# Patient Record
Sex: Female | Born: 1975 | Race: Black or African American | Hispanic: No | Smoking: Former smoker
Health system: Southern US, Community
[De-identification: ages and names within clinical notes are randomized; demographics above are authoritative.]

## PROBLEM LIST (undated history)

## (undated) ENCOUNTER — Inpatient Hospital Stay (HOSPITAL_COMMUNITY): Payer: Self-pay

## (undated) DIAGNOSIS — R87629 Unspecified abnormal cytological findings in specimens from vagina: Secondary | ICD-10-CM

## (undated) DIAGNOSIS — M797 Fibromyalgia: Secondary | ICD-10-CM

## (undated) DIAGNOSIS — F329 Major depressive disorder, single episode, unspecified: Secondary | ICD-10-CM

## (undated) DIAGNOSIS — F418 Other specified anxiety disorders: Secondary | ICD-10-CM

## (undated) HISTORY — PX: COLPOSCOPY: SHX161

## (undated) HISTORY — DX: Other specified anxiety disorders: F41.8

## (undated) HISTORY — DX: Unspecified abnormal cytological findings in specimens from vagina: R87.629

## (undated) HISTORY — PX: OTHER SURGICAL HISTORY: SHX169

## (undated) HISTORY — DX: Major depressive disorder, single episode, unspecified: F32.9

---

## 2016-01-05 DIAGNOSIS — F331 Major depressive disorder, recurrent, moderate: Secondary | ICD-10-CM | POA: Diagnosis not present

## 2016-01-06 DIAGNOSIS — M791 Myalgia: Secondary | ICD-10-CM | POA: Diagnosis not present

## 2016-01-06 DIAGNOSIS — F411 Generalized anxiety disorder: Secondary | ICD-10-CM | POA: Diagnosis not present

## 2016-01-06 DIAGNOSIS — R748 Abnormal levels of other serum enzymes: Secondary | ICD-10-CM | POA: Diagnosis not present

## 2016-01-19 DIAGNOSIS — F331 Major depressive disorder, recurrent, moderate: Secondary | ICD-10-CM | POA: Diagnosis not present

## 2016-02-02 DIAGNOSIS — F331 Major depressive disorder, recurrent, moderate: Secondary | ICD-10-CM | POA: Diagnosis not present

## 2016-02-09 DIAGNOSIS — F331 Major depressive disorder, recurrent, moderate: Secondary | ICD-10-CM | POA: Diagnosis not present

## 2016-03-01 DIAGNOSIS — F331 Major depressive disorder, recurrent, moderate: Secondary | ICD-10-CM | POA: Diagnosis not present

## 2016-03-12 DIAGNOSIS — F331 Major depressive disorder, recurrent, moderate: Secondary | ICD-10-CM | POA: Diagnosis not present

## 2016-04-05 DIAGNOSIS — F331 Major depressive disorder, recurrent, moderate: Secondary | ICD-10-CM | POA: Diagnosis not present

## 2016-04-11 DIAGNOSIS — J309 Allergic rhinitis, unspecified: Secondary | ICD-10-CM | POA: Diagnosis not present

## 2016-04-11 DIAGNOSIS — J322 Chronic ethmoidal sinusitis: Secondary | ICD-10-CM | POA: Diagnosis not present

## 2016-04-11 DIAGNOSIS — F324 Major depressive disorder, single episode, in partial remission: Secondary | ICD-10-CM | POA: Diagnosis not present

## 2016-04-11 DIAGNOSIS — Z720 Tobacco use: Secondary | ICD-10-CM | POA: Diagnosis not present

## 2016-04-19 DIAGNOSIS — F331 Major depressive disorder, recurrent, moderate: Secondary | ICD-10-CM | POA: Diagnosis not present

## 2016-04-20 DIAGNOSIS — L299 Pruritus, unspecified: Secondary | ICD-10-CM | POA: Diagnosis not present

## 2017-03-04 ENCOUNTER — Inpatient Hospital Stay (HOSPITAL_COMMUNITY)
Admission: AD | Admit: 2017-03-04 | Discharge: 2017-03-04 | Disposition: A | Discharge status: Home / Self Care | Payer: Medicaid Other | Source: Ambulatory Visit | Attending: Obstetrics and Gynecology | Admitting: Obstetrics and Gynecology

## 2017-03-04 ENCOUNTER — Inpatient Hospital Stay (HOSPITAL_COMMUNITY): Payer: Medicaid Other

## 2017-03-04 ENCOUNTER — Encounter (HOSPITAL_COMMUNITY): Payer: Self-pay | Admitting: *Deleted

## 2017-03-04 DIAGNOSIS — Z3201 Encounter for pregnancy test, result positive: Secondary | ICD-10-CM | POA: Insufficient documentation

## 2017-03-04 DIAGNOSIS — O26891 Other specified pregnancy related conditions, first trimester: Secondary | ICD-10-CM | POA: Insufficient documentation

## 2017-03-04 DIAGNOSIS — R109 Unspecified abdominal pain: Secondary | ICD-10-CM | POA: Diagnosis present

## 2017-03-04 DIAGNOSIS — B9689 Other specified bacterial agents as the cause of diseases classified elsewhere: Secondary | ICD-10-CM

## 2017-03-04 DIAGNOSIS — M797 Fibromyalgia: Secondary | ICD-10-CM | POA: Insufficient documentation

## 2017-03-04 DIAGNOSIS — Z3A01 Less than 8 weeks gestation of pregnancy: Secondary | ICD-10-CM | POA: Insufficient documentation

## 2017-03-04 DIAGNOSIS — O209 Hemorrhage in early pregnancy, unspecified: Secondary | ICD-10-CM | POA: Diagnosis present

## 2017-03-04 DIAGNOSIS — O3680X0 Pregnancy with inconclusive fetal viability, not applicable or unspecified: Secondary | ICD-10-CM

## 2017-03-04 DIAGNOSIS — N760 Acute vaginitis: Secondary | ICD-10-CM | POA: Insufficient documentation

## 2017-03-04 DIAGNOSIS — O23591 Infection of other part of genital tract in pregnancy, first trimester: Secondary | ICD-10-CM | POA: Insufficient documentation

## 2017-03-04 HISTORY — DX: Fibromyalgia: M79.7

## 2017-03-04 LAB — WET PREP, GENITAL
Sperm: NONE SEEN
TRICH WET PREP: NONE SEEN
Yeast Wet Prep HPF POC: NONE SEEN

## 2017-03-04 LAB — URINALYSIS, ROUTINE W REFLEX MICROSCOPIC
Bilirubin Urine: NEGATIVE
GLUCOSE, UA: NEGATIVE mg/dL
Ketones, ur: NEGATIVE mg/dL
Nitrite: NEGATIVE
PH: 5 (ref 5.0–8.0)
PROTEIN: 100 mg/dL — AB
SPECIFIC GRAVITY, URINE: 1.03 (ref 1.005–1.030)

## 2017-03-04 LAB — POCT PREGNANCY, URINE: Preg Test, Ur: POSITIVE — AB

## 2017-03-04 LAB — CBC
HCT: 40.5 % (ref 36.0–46.0)
HEMOGLOBIN: 13.6 g/dL (ref 12.0–15.0)
MCH: 28.8 pg (ref 26.0–34.0)
MCHC: 33.6 g/dL (ref 30.0–36.0)
MCV: 85.8 fL (ref 78.0–100.0)
Platelets: 297 10*3/uL (ref 150–400)
RBC: 4.72 MIL/uL (ref 3.87–5.11)
RDW: 14.9 % (ref 11.5–15.5)
WBC: 13.1 10*3/uL — ABNORMAL HIGH (ref 4.0–10.5)

## 2017-03-04 LAB — HCG, QUANTITATIVE, PREGNANCY: hCG, Beta Chain, Quant, S: 114 m[IU]/mL — ABNORMAL HIGH (ref <5)

## 2017-03-04 MED ORDER — METRONIDAZOLE 500 MG PO TABS
500.0000 mg | ORAL_TABLET | Freq: Two times a day (BID) | ORAL | 0 refills | Status: DC
Start: 2017-03-04 — End: 2017-07-01

## 2017-03-04 NOTE — MAU Provider Note (Signed)
History     CSN: [id number]  Arrival date and time: 03/04/17 1723  First Provider Initiated Contact with Patient 03/04/17 2027      Chief Complaint  Patient presents with  . Vaginal Bleeding  . Abdominal Cramping   HPI  Kerri Guerra is a 41 y.o. G1P0 at [redacted]w[redacted]d by LMP who presents with abdominal cramping and vaginal bleeding. Reports vaginal spotting for the last 2 weeks, was initially pink, then brown. For the last 2 days has had bright red bleeding that she has had to wear a pad for. Not saturating pads & not passing clots. Also reports lower abdominal cramping for the last few weeks, initially pain was in LLQ but is now throughout lower abdomen. Rates pain 3/10 & describes as pressure & cramping. Denies n/v/d or dysuria. Last BM was yesterday. Last intercourse 2 days ago. Has not started prenatal care yet.   OB History    Gravida Para Term Preterm AB Living   1             SAB TAB Ectopic Multiple Live Births                  Past Medical History:  Diagnosis Date  . Fibromyalgia     Past Surgical History:  Procedure Laterality Date  . NO PAST SURGERIES      No family history on file.  Social History  Substance Use Topics  . Smoking status: Not on file  . Smokeless tobacco: Not on file  . Alcohol use Not on file    Allergies: Allergies not on file  No prescriptions prior to admission.    Review of Systems  Constitutional: Negative.   Gastrointestinal: Positive for abdominal pain and constipation. Negative for diarrhea, nausea and vomiting.  Genitourinary: Positive for vaginal bleeding. Negative for dysuria and vaginal discharge.   Physical Exam   Blood pressure 131/77, pulse 75, temperature 98.4 F (36.9 C), temperature source Oral, resp. rate 18, height 5\' 7"  (1.702 m), weight 239 lb (108.4 kg), last menstrual period 01/13/2017.  Physical Exam  Nursing note and vitals reviewed. Constitutional: She is oriented to person, place, and time. She appears  well-developed and well-nourished. No distress.  HENT:  Head: Normocephalic and atraumatic.  Eyes: Conjunctivae are normal. Right eye exhibits no discharge. Left eye exhibits no discharge. No scleral icterus.  Neck: Normal range of motion.  Respiratory: Effort normal. No respiratory distress.  GI: Soft. She exhibits no distension. There is no tenderness.  Genitourinary: Uterus normal. Cervix exhibits discharge (yellow mucoid discharge). Cervix exhibits no motion tenderness and no friability. Right adnexum displays no mass and no tenderness. Left adnexum displays no mass and no tenderness. There is bleeding (no active bleeding; minimal amount of dark red blood in vaginal vault) in the vagina.  Genitourinary Comments: Cervix closed  Neurological: She is alert and oriented to person, place, and time.  Skin: Skin is warm and dry. She is not diaphoretic.  Psychiatric: She has a normal mood and affect. Her behavior is normal. Judgment and thought content normal.    MAU Course  Procedures Results for orders placed or performed during the hospital encounter of 03/04/17 (from the past 24 hour(s))  Urinalysis, Routine w reflex microscopic     Status: Abnormal   Collection Time: 03/04/17  6:00 PM  Result Value Ref Range   Color, Urine AMBER (A) YELLOW   APPearance HAZY (A) CLEAR   Specific Gravity, Urine 1.030 1.005 - 1.030   pH  5.0 5.0 - 8.0   Glucose, UA NEGATIVE NEGATIVE mg/dL   Hgb urine dipstick LARGE (A) NEGATIVE   Bilirubin Urine NEGATIVE NEGATIVE   Ketones, ur NEGATIVE NEGATIVE mg/dL   Protein, ur 784100 (A) NEGATIVE mg/dL   Nitrite NEGATIVE NEGATIVE   Leukocytes, UA LARGE (A) NEGATIVE   RBC / HPF 6-30 0 - 5 RBC/hpf   WBC, UA TOO NUMEROUS TO COUNT 0 - 5 WBC/hpf   Bacteria, UA RARE (A) NONE SEEN   Squamous Epithelial / LPF 6-30 (A) NONE SEEN   Mucous PRESENT   Pregnancy, urine POC     Status: Abnormal   Collection Time: 03/04/17  6:09 PM  Result Value Ref Range   Preg Test, Ur  POSITIVE (A) NEGATIVE  CBC     Status: Abnormal   Collection Time: 03/04/17  8:23 PM  Result Value Ref Range   WBC 13.1 (H) 4.0 - 10.5 K/uL   RBC 4.72 3.87 - 5.11 MIL/uL   Hemoglobin 13.6 12.0 - 15.0 g/dL   HCT 69.640.5 29.536.0 - 28.446.0 %   MCV 85.8 78.0 - 100.0 fL   MCH 28.8 26.0 - 34.0 pg   MCHC 33.6 30.0 - 36.0 g/dL   RDW 13.214.9 44.011.5 - 10.215.5 %   Platelets 297 150 - 400 K/uL  ABO/Rh     Status: None   Collection Time: 03/04/17  8:23 PM  Result Value Ref Range   ABO/RH(D) B POS   hCG, quantitative, pregnancy     Status: Abnormal   Collection Time: 03/04/17  8:23 PM  Result Value Ref Range   hCG, Beta Chain, Quant, S 114 (H) <5 mIU/mL  Wet prep, genital     Status: Abnormal   Collection Time: 03/04/17 10:30 PM  Result Value Ref Range   Yeast Wet Prep HPF POC NONE SEEN NONE SEEN   Trich, Wet Prep NONE SEEN NONE SEEN   Clue Cells Wet Prep HPF POC PRESENT (A) NONE SEEN   WBC, Wet Prep HPF POC MANY (A) NONE SEEN   Sperm NONE SEEN    [place] Ob Comp Less 14 Wks  Result Date: 03/04/2017 CLINICAL DATA:  Spotting and pressure EXAM: OBSTETRIC <14 WK US AND TRANSVAGINAL OB US TECHNIQUE: Both transabdominal and transvaginal ultrasound examinations were performed for complete evaluation of the gestation as well as the maternal uterus, adnexal regions, and pelvic cul-de-sac. Transvaginal technique was performed to assess early pregnancy. COMPARISON:  None. FINDINGS: Intrauterine gestational sac: Not visualized Yolk sac:  Not visualized Embryo:  Not visualized Subchorionic hemorrhage:  None visualized. Maternal uterus/adnexae: Bilateral ovaries are within normal limits. The right ovary measures 2.1 x 3.6 x 2.1 cm. The left ovary measures 1.9 x 3.6 x 2.3 cm. Thickened appearance of the endometrium. Possible small amount of echogenic hemorrhagic material in the cervix. Trace free fluid in the cul-de-sac. IMPRESSION: 1. No intrauterine gestational sac is visualized. Findings consistent with pregnancy of unknown  location of which considerations include recent miscarriage, intrauterine pregnancy too early to visualize, or occult ectopic. Correlation with serial HCG recommended with repeat ultrasound as indicated. 2. Possible small amount of hemorrhagic material within the cervix. Trace amount of free fluid in the cul-de-sac. Electronically Signed   By: Jasmine PangKim  Fujinaga M.D.   On: 03/04/2017 21:04   [place] Ob Transvaginal  Result Date: 03/04/2017 CLINICAL DATA:  Spotting and pressure EXAM: OBSTETRIC <14 WK US AND TRANSVAGINAL OB US TECHNIQUE: Both transabdominal and transvaginal ultrasound examinations were performed for complete evaluation of the gestation  as well as the maternal uterus, adnexal regions, and pelvic cul-de-sac. Transvaginal technique was performed to assess early pregnancy. COMPARISON:  None. FINDINGS: Intrauterine gestational sac: Not visualized Yolk sac:  Not visualized Embryo:  Not visualized Subchorionic hemorrhage:  None visualized. Maternal uterus/adnexae: Bilateral ovaries are within normal limits. The right ovary measures 2.1 x 3.6 x 2.1 cm. The left ovary measures 1.9 x 3.6 x 2.3 cm. Thickened appearance of the endometrium. Possible small amount of echogenic hemorrhagic material in the cervix. Trace free fluid in the cul-de-sac. IMPRESSION: 1. No intrauterine gestational sac is visualized. Findings consistent with pregnancy of unknown location of which considerations include recent miscarriage, intrauterine pregnancy too early to visualize, or occult ectopic. Correlation with serial HCG recommended with repeat ultrasound as indicated. 2. Possible small amount of hemorrhagic material within the cervix. Trace amount of free fluid in the cul-de-sac. Electronically Signed   By: Jasmine Pang M.D.   On: 03/04/2017 21:04    MDM +UPT UA, wet prep, GC/chlamydia, CBC, ABO/Rh, quant hCG, HIV, and [place] today to rule out ectopic pregnancy B positive BHCG 114, ultrasound shows no IUP or adnexal mass Assessment  and Plan  A: 1. Pregnancy of unknown anatomic location   2. Vaginal bleeding in pregnancy, first trimester   3. BV (bacterial vaginosis)    P: Discharge home Rx flagyl Pelvic rest Go to [facility] Thursday morning for bhcg  Discussed reasons to return to MAU GC/CT pending   Judeth Horn 03/04/2017, 8:27 PM

## 2017-03-04 NOTE — Discharge Instructions (Signed)
Return to care  °· If you have heavier bleeding that soaks through more that 2 pads per hour for an hour or more °· If you bleed so much that you feel like you might pass out or you do pass out °· If you have significant abdominal pain that is not improved with Tylenol  °· If you develop a fever > 100.5 ° ° ° ° °Bacterial Vaginosis °Bacterial vaginosis is a vaginal infection that occurs when the normal balance of bacteria in the vagina is disrupted. It results from an overgrowth of certain bacteria. This is the most common vaginal infection among women ages 15-44. °Because bacterial vaginosis increases your risk for STIs (sexually transmitted infections), getting treated can help reduce your risk for chlamydia, gonorrhea, herpes, and HIV (human immunodeficiency virus). Treatment is also important for preventing complications in pregnant women, because this condition can cause an early (premature) delivery. °What are the causes? °This condition is caused by an increase in harmful bacteria that are normally present in small amounts in the vagina. However, the reason that the condition develops is not fully understood. °What increases the risk? °The following factors may make you more likely to develop this condition: °· Having a new sexual partner or multiple sexual partners. °· Having unprotected sex. °· Douching. °· Having an intrauterine device (IUD). °· Smoking. °· Drug and alcohol abuse. °· Taking certain antibiotic medicines. °· Being pregnant. ° °You cannot get bacterial vaginosis from toilet seats, bedding, swimming pools, or contact with objects around you. °What are the signs or symptoms? °Symptoms of this condition include: °· Grey or white vaginal discharge. The discharge can also be watery or foamy. °· A fish-like odor with discharge, especially after sexual intercourse or during menstruation. °· Itching in and around the vagina. °· Burning or pain with urination. ° °Some women with bacterial vaginosis  have no signs or symptoms. °How is this diagnosed? °This condition is diagnosed based on: °· Your medical history. °· A physical exam of the vagina. °· Testing a sample of vaginal fluid under a microscope to look for a large amount of bad bacteria or abnormal cells. Your health care provider may use a cotton swab or a small wooden spatula to collect the sample. ° °How is this treated? °This condition is treated with antibiotics. These may be given as a pill, a vaginal cream, or a medicine that is put into the vagina (suppository). If the condition comes back after treatment, a second round of antibiotics may be needed. °Follow these instructions at home: °Medicines °· Take over-the-counter and prescription medicines only as told by your health care provider. °· Take or use your antibiotic as told by your health care provider. Do not stop taking or using the antibiotic even if you start to feel better. °General instructions °· If you have a female sexual partner, tell her that you have a vaginal infection. She should see her health care provider and be treated if she has symptoms. If you have a female sexual partner, he does not need treatment. °· During treatment: °? Avoid sexual activity until you finish treatment. °? Do not douche. °? Avoid alcohol as directed by your health care provider. °? Avoid breastfeeding as directed by your health care provider. °· Drink enough water and fluids to keep your urine clear or pale yellow. °· Keep the area around your vagina and rectum clean. °? Wash the area daily with warm water. °? Wipe yourself from front to back after using the   toilet. °· Keep all follow-up visits as told by your health care provider. This is important. °How is this prevented? °· Do not douche. °· Wash the outside of your vagina with warm water only. °· Use protection when having sex. This includes latex condoms and dental dams. °· Limit how many sexual partners you have. To help prevent bacterial vaginosis,  it is best to have sex with just one partner (monogamous). °· Make sure you and your sexual partner are tested for STIs. °· Wear cotton or cotton-lined underwear. °· Avoid wearing tight pants and pantyhose, especially during summer. °· Limit the amount of alcohol that you drink. °· Do not use any products that contain nicotine or tobacco, such as cigarettes and e-cigarettes. If you need help quitting, ask your health care provider. °· Do not use illegal drugs. °Where to find more information: °· Centers for Disease Control and Prevention: www.cdc.gov/std °· American Sexual Health Association (ASHA): www.ashastd.org °· U.S. Department of Health and Human Services, Office on Women's Health: www.womenshealth.gov/ or https://www.womenshealth.gov/a-z-topics/bacterial-vaginosis °Contact a health care provider if: °· Your symptoms do not improve, even after treatment. °· You have more discharge or pain when urinating. °· You have a fever. °· You have pain in your abdomen. °· You have pain during sex. °· You have vaginal bleeding between periods. °Summary °· Bacterial vaginosis is a vaginal infection that occurs when the normal balance of bacteria in the vagina is disrupted. °· Because bacterial vaginosis increases your risk for STIs (sexually transmitted infections), getting treated can help reduce your risk for chlamydia, gonorrhea, herpes, and HIV (human immunodeficiency virus). Treatment is also important for preventing complications in pregnant women, because the condition can cause an early (premature) delivery. °· This condition is treated with antibiotic medicines. These may be given as a pill, a vaginal cream, or a medicine that is put into the vagina (suppository). °This information is not intended to replace advice given to you by your health care provider. Make sure you discuss any questions you have with your health care provider. °Document Released: 09/17/2005 Document Revised: 06/02/2016 Document Reviewed:  06/02/2016 °Elsevier Interactive Patient Education © 2017 [employer]. ° °

## 2017-03-04 NOTE — MAU Note (Signed)
Pt presents to MAU with complaints of positive home pregnancy on May 21st. Pt states she started having vaginal spotting two weeks ago and it feels like pressure in her lower abdomen. Last intercourse June 2nd.

## 2017-03-05 LAB — ABO/RH: ABO/RH(D): B POS

## 2017-03-05 LAB — GC/CHLAMYDIA PROBE AMP (~~LOC~~) NOT AT ARMC
Chlamydia: NEGATIVE
Neisseria Gonorrhea: NEGATIVE

## 2017-03-07 ENCOUNTER — Ambulatory Visit: Payer: Medicaid Other | Admitting: *Deleted

## 2017-03-07 DIAGNOSIS — O3680X0 Pregnancy with inconclusive fetal viability, not applicable or unspecified: Secondary | ICD-10-CM

## 2017-03-07 LAB — HCG, QUANTITATIVE, PREGNANCY: HCG, BETA CHAIN, QUANT, S: 16 m[IU]/mL — AB (ref <5)

## 2017-03-07 NOTE — Patient Instructions (Signed)

## 2017-03-07 NOTE — Progress Notes (Signed)
Pt in for stat hcg level. Thinks that she has had an sab. Pt having some bleeding and no pain. Advised patient that I will call her this afternoon with her results. [phone] Pt requested resources for pregnancy loss, pt given information.  Discussed results with Dr. Adrian BlackwaterStinson he states that patient does not require any followup and has had a miscarriage. Pt informed of results and had no further questions or concerns.

## 2017-06-06 DIAGNOSIS — F411 Generalized anxiety disorder: Secondary | ICD-10-CM | POA: Diagnosis not present

## 2017-06-14 ENCOUNTER — Ambulatory Visit: Payer: Self-pay | Admitting: Family Medicine

## 2017-06-17 DIAGNOSIS — F411 Generalized anxiety disorder: Secondary | ICD-10-CM | POA: Diagnosis not present

## 2017-06-27 DIAGNOSIS — F411 Generalized anxiety disorder: Secondary | ICD-10-CM | POA: Diagnosis not present

## 2017-07-01 ENCOUNTER — Encounter: Payer: Self-pay | Admitting: Family Medicine

## 2017-07-01 ENCOUNTER — Ambulatory Visit (INDEPENDENT_AMBULATORY_CARE_PROVIDER_SITE_OTHER): Payer: BLUE CROSS/BLUE SHIELD | Admitting: Family Medicine

## 2017-07-01 ENCOUNTER — Other Ambulatory Visit (HOSPITAL_COMMUNITY)
Admission: RE | Admit: 2017-07-01 | Discharge: 2017-07-01 | Disposition: A | Discharge status: Home / Self Care | Payer: BLUE CROSS/BLUE SHIELD | Source: Ambulatory Visit | Attending: Family Medicine | Admitting: Family Medicine

## 2017-07-01 VITALS — Ht 67.5 in | Wt 241.0 lb

## 2017-07-01 VITALS — BP 112/84 | HR 67 | Temp 98.3°F | Ht 67.0 in | Wt 241.0 lb

## 2017-07-01 DIAGNOSIS — N898 Other specified noninflammatory disorders of vagina: Secondary | ICD-10-CM

## 2017-07-01 DIAGNOSIS — Z1151 Encounter for screening for human papillomavirus (HPV): Secondary | ICD-10-CM

## 2017-07-01 DIAGNOSIS — Z124 Encounter for screening for malignant neoplasm of cervix: Secondary | ICD-10-CM

## 2017-07-01 DIAGNOSIS — Z9109 Other allergy status, other than to drugs and biological substances: Secondary | ICD-10-CM | POA: Diagnosis not present

## 2017-07-01 DIAGNOSIS — Z01419 Encounter for gynecological examination (general) (routine) without abnormal findings: Secondary | ICD-10-CM | POA: Diagnosis not present

## 2017-07-01 DIAGNOSIS — Z113 Encounter for screening for infections with a predominantly sexual mode of transmission: Secondary | ICD-10-CM

## 2017-07-01 DIAGNOSIS — M797 Fibromyalgia: Secondary | ICD-10-CM | POA: Diagnosis not present

## 2017-07-01 DIAGNOSIS — F418 Other specified anxiety disorders: Secondary | ICD-10-CM

## 2017-07-01 DIAGNOSIS — Z0000 Encounter for general adult medical examination without abnormal findings: Secondary | ICD-10-CM | POA: Insufficient documentation

## 2017-07-01 DIAGNOSIS — R87610 Atypical squamous cells of undetermined significance on cytologic smear of cervix (ASC-US): Secondary | ICD-10-CM | POA: Diagnosis not present

## 2017-07-01 DIAGNOSIS — F32A Depression, unspecified: Secondary | ICD-10-CM | POA: Insufficient documentation

## 2017-07-01 HISTORY — DX: Other specified anxiety disorders: F41.8

## 2017-07-01 MED ORDER — BUSPIRONE HCL 5 MG PO TABS: 5.0000 mg | ORAL_TABLET | Freq: Three times a day (TID) | ORAL | 2 refills | Status: DC

## 2017-07-01 MED ORDER — TRAZODONE HCL 50 MG PO TABS: 25.0000 mg | ORAL_TABLET | Freq: Every evening | ORAL | 2 refills | Status: DC | PRN

## 2017-07-01 MED ORDER — DULOXETINE HCL 30 MG PO CPEP: 30.0000 mg | ORAL_CAPSULE | Freq: Every day | ORAL | 2 refills | Status: DC

## 2017-07-01 NOTE — Progress Notes (Signed)
GYNECOLOGY ANNUAL PREVENTATIVE CARE ENCOUNTER NOTE  Subjective:   Kerri Guerra is a 41 y.o. G35P0010 female here for a routine annual gynecologic exam.  Current complaints: vaginal odor.   Denies abnormal vaginal bleeding, discharge, pelvic pain, problems with intercourse or other gynecologic concerns.    Gynecologic History Patient's last menstrual period was 01/13/2017. Patient is sexually active  Contraception: OCP (estrogen/progesterone) Last Pap: 2015. Results were: normal. Has history of abnormal PAP several years ago. Last mammogram: n/a  Obstetric History OB History  Gravida Para Term Preterm AB Living  1       1    SAB TAB Ectopic Multiple Live Births  1            # Outcome Date GA Lbr Len/2nd Weight Sex Delivery Anes PTL Lv  1 SAB 04/08/17 [redacted]w[redacted]d             Past Medical History:  Diagnosis Date  . Fibromyalgia   . Vaginal Pap smear, abnormal     Past Surgical History:  Procedure Laterality Date  . COLPOSCOPY      No current outpatient prescriptions on file prior to visit.   No current facility-administered medications on file prior to visit.     No Known Allergies  Social History   Social History  . Marital status: Single    Spouse name: N/A  . Number of children: N/A  . Years of education: N/A   Occupational History  . Not on file.   Social History Main Topics  . Smoking status: Light Tobacco Smoker  . Smokeless tobacco: Never Used  . Alcohol use 1.8 oz/week    3 Standard drinks or equivalent per week  . Drug use: No  . Sexual activity: Yes    Birth control/ protection: Pill   Other Topics Concern  . Not on file   Social History Narrative  . No narrative on file    Family History  Problem Relation Age of Onset  . Cancer Maternal Grandmother   . Cancer Father        PROSTATE  . Diabetes Mother   . Hypertension Mother   . Stroke Neg Hx     The following portions of the patient's history were reviewed and updated as  appropriate: allergies, current medications, past family history, past medical history, past social history, past surgical history and problem list.  Review of Systems Pertinent items are noted in HPI.   Objective:  Ht 5' 7.5" (1.715 m)   Wt 241 lb (109.3 kg)   LMP 01/13/2017   Breastfeeding? No   BMI 37.19 kg/m  CONSTITUTIONAL: Well-developed, well-nourished female in no acute distress.  HENT:  Normocephalic, atraumatic, External right and left ear normal. Oropharynx is clear and moist EYES: Conjunctivae and EOM are normal. Pupils are equal, round, and reactive to light. No scleral icterus.  NECK: Normal range of motion, supple, no masses.  Normal thyroid.   CARDIOVASCULAR: Normal heart rate noted, regular rhythm RESPIRATORY: Clear to auscultation bilaterally. Effort and breath sounds normal, no problems with respiration noted. BREASTS: Symmetric in size. No masses, skin changes, nipple drainage, or lymphadenopathy. ABDOMEN: Soft, normal bowel sounds, no distention noted.  No tenderness, rebound or guarding.  PELVIC: Normal appearing external genitalia; normal appearing vaginal mucosa and cervix.  No abnormal discharge noted.  Pap smear obtained.  Normal uterine size, no other palpable masses, no uterine or adnexal tenderness. MUSCULOSKELETAL: Normal range of motion. No tenderness.  No cyanosis, clubbing, or edema.  2+ distal pulses. SKIN: Skin is warm and dry. No rash noted. Not diaphoretic. No erythema. No pallor. NEUROLOGIC: Alert and oriented to person, place, and time. Normal reflexes, muscle tone coordination. No cranial nerve deficit noted. PSYCHIATRIC: Normal mood and affect. Normal behavior. Normal judgment and thought content.  Assessment:  Annual gynecologic examination with pap smear   Plan:  Will follow up results of pap smear and manage accordingly.  STD testing discussed. Patient declined testing Discussed exercise and diet Calcium and Vitamin D supplementation  discussed  Routine preventative health maintenance measures emphasized. Please refer to After Visit Summary for other counseling recommendations.    Candelaria Celeste, DO [facility]

## 2017-07-01 NOTE — Patient Instructions (Addendum)
[email or web address]  If you do not hear anything about your referral in the next 1-2 weeks, call our office and ask for an update.  Let us know if you need anything.

## 2017-07-01 NOTE — Progress Notes (Signed)
LMP:06-15-17

## 2017-07-01 NOTE — Progress Notes (Signed)
Chief Complaint  Patient presents with  . Establish Care       New Patient Visit SUBJECTIVE: HPI: Kerri Guerra is an 41 y.o.female who is being seen for establishing care.  She has a history of fibromyalgia. She also has a history of anxiety with depression. Before her current job, she had quit her other job. There is a span of time where she was uninsured and stopped all of her medication. She used to be on trazodone as needed for sleep, BuSpar 5 mg twice daily, and Cymbalta 60 mg daily. She's been without this for several months. She is currently seeing a Veterinary surgeon. Her job serves as a stressor as well as a recent miscarriage over the summer. That was her first pregnancy. While there are no formal diagnoses, she believes her mother has anxiety. No other family history of psychiatric illnesses. She has no thoughts of harming herself or others. She is currently looking for a new job. She does walk, however does not exercise very routinely. She does not lift weights. She does notice that she feels better after exercise.   No Known Allergies  Past Medical History:  Diagnosis Date  . Anxiety with depression 07/01/2017  . Fibromyalgia   . Vaginal Pap smear, abnormal    Past Surgical History:  Procedure Laterality Date  . COLPOSCOPY     Social History   Social History  . Marital status: Single   Social History Main Topics  . Smoking status: Light Tobacco Smoker  . Smokeless tobacco: Never Used  . Alcohol use 1.8 oz/week    3 Standard drinks or equivalent per week  . Drug use: No  . Sexual activity: Yes    Birth control/ protection: Pill   Family History  Problem Relation Age of Onset  . Cancer Maternal Grandmother   . Cancer Father        PROSTATE  . Diabetes Mother   . Hypertension Mother   . Stroke Neg Hx      Current Outpatient Prescriptions:  .  ibuprofen (ADVIL,MOTRIN) 200 MG tablet, Take 200 mg by mouth every 8 (eight) hours as needed., Disp: , Rfl:  .  loratadine  (CLARITIN) 10 MG tablet, Take 10 mg by mouth daily., Disp: , Rfl:  .  norethindrone-ethinyl estradiol (JUNEL FE,GILDESS FE,LOESTRIN FE) 1-20 MG-MCG tablet, Take 1 tablet by mouth daily., Disp: , Rfl:  .  busPIRone (BUSPAR) 5 MG tablet, Take 1 tablet (5 mg total) by mouth 3 (three) times daily., Disp: 60 tablet, Rfl: 2 .  DULoxetine (CYMBALTA) 30 MG capsule, Take 1 capsule (30 mg total) by mouth daily., Disp: 30 capsule, Rfl: 2 .  traZODone (DESYREL) 50 MG tablet, Take 0.5-1 tablets (25-50 mg total) by mouth at bedtime as needed for sleep., Disp: 30 tablet, Rfl: 2  Patient's last menstrual period was 01/13/2017.  ROS MSK: +back pain  Neuro: Denies Weakness    OBJECTIVE: BP 112/84 (BP Location: Left Arm, Patient Position: Sitting, Cuff Size: Large)   Pulse 67   Temp 98.3 F (36.8 C) (Oral)   Ht  (1.702 m)   Wt 241 lb (109.3 kg)   LMP 01/13/2017   SpO2 99%   BMI 37.75 kg/m   Constitutional: -  VS reviewed -  Well developed, well nourished, appears stated age -  No apparent distress  Psychiatric: -  Oriented to person, place, and time -  Memory intact -  Affect and mood normal -  Fluent conversation, good eye contact -  Judgment and insight age appropriate  Eye: -  Conjunctivae clear, no discharge -  Pupils symmetric, round, reactive to light  ENMT: -  MMM    Pharynx moist, no exudate, no erythema  Neck: -  No gross swelling, no palpable masses -  Thyroid midline, not enlarged, mobile, no palpable masses  Cardiovascular: -  RRR -  No LE edema  Respiratory: -  Normal respiratory effort, no accessory muscle use, no retraction -  Breath sounds equal, no wheezes, no ronchi, no crackles  Musculoskeletal: -  5/5 strength throughout -  + Tender to palpation over paraspinal musculature from cervical spine to lumbar spine   Skin: -  No significant lesion on inspection -  Warm and dry to palpation   ASSESSMENT/PLAN: Fibromyalgia - Plan: DULoxetine (CYMBALTA) 30 MG  capsule  Anxiety with depression - Plan: DULoxetine (CYMBALTA) 30 MG capsule, traZODone (DESYREL) 50 MG tablet, busPIRone (BUSPAR) 5 MG tablet  Environmental allergies - Plan: Ambulatory referral to Allergy  Patient instructed to sign release of records form from her previous PCP. Patient should return in 6-8 weeks to see how she is doing back on medicine- will start her at lower dose as she has been off of Cymbalta for a while. [facility]guide info given in avs. Refer to allergy for pt requested allergy testing. The patient voiced understanding and agreement to the plan.   Jilda Roche [place], DO 07/01/17  12:11 PM

## 2017-07-01 NOTE — Progress Notes (Signed)
Pre visit review using our clinic review tool, if applicable. No additional management support is needed unless otherwise documented below in the visit note. 

## 2017-07-01 NOTE — Patient Instructions (Signed)

## 2017-07-03 NOTE — Addendum Note (Signed)
Addended by: Anell Barr on: 07/03/2017 04:57 PM   Modules accepted: Orders

## 2017-07-05 LAB — CYTOLOGY - PAP
BACTERIAL VAGINITIS: NEGATIVE
CHLAMYDIA, DNA PROBE: NEGATIVE
Candida vaginitis: NEGATIVE
DIAGNOSIS: UNDETERMINED — AB
HPV: DETECTED — AB
NEISSERIA GONORRHEA: NEGATIVE
Trichomonas: NEGATIVE

## 2017-07-08 DIAGNOSIS — F411 Generalized anxiety disorder: Secondary | ICD-10-CM | POA: Diagnosis not present

## 2017-07-11 ENCOUNTER — Telehealth: Payer: Self-pay

## 2017-07-11 NOTE — Telephone Encounter (Signed)
-----   Message from Levie Heritage, DO sent at 07/05/2017 11:38 AM EDT ----- ASCUS with + HPV. Needs colposcopy. Please inform patient.

## 2017-07-11 NOTE — Telephone Encounter (Signed)
Left message for patient to return call to office.  Emmer Lillibridge RNBSN 

## 2017-07-16 DIAGNOSIS — F411 Generalized anxiety disorder: Secondary | ICD-10-CM | POA: Diagnosis not present

## 2017-07-20 DIAGNOSIS — F4322 Adjustment disorder with anxiety: Secondary | ICD-10-CM | POA: Diagnosis not present

## 2017-07-25 NOTE — Telephone Encounter (Signed)
Patient scheduled for 08-01-17. Armandina StammerJennifer Aamna Mallozzi RNBSN

## 2017-07-27 DIAGNOSIS — F4322 Adjustment disorder with anxiety: Secondary | ICD-10-CM | POA: Diagnosis not present

## 2017-07-29 DIAGNOSIS — F411 Generalized anxiety disorder: Secondary | ICD-10-CM | POA: Diagnosis not present

## 2017-07-30 ENCOUNTER — Telehealth: Payer: Self-pay

## 2017-07-30 NOTE — Telephone Encounter (Signed)
Attempted to reach patient to speak with her about the upcoming appointment for colposcopy.   Unable to leave message because mailbox full. Armandina StammerJennifer Njeri Vicente RNBSN

## 2017-08-01 ENCOUNTER — Encounter: Payer: BLUE CROSS/BLUE SHIELD | Admitting: Family Medicine

## 2017-08-01 DIAGNOSIS — R87610 Atypical squamous cells of undetermined significance on cytologic smear of cervix (ASC-US): Secondary | ICD-10-CM

## 2017-08-07 DIAGNOSIS — R109 Unspecified abdominal pain: Secondary | ICD-10-CM | POA: Diagnosis not present

## 2017-08-08 DIAGNOSIS — F4322 Adjustment disorder with anxiety: Secondary | ICD-10-CM | POA: Diagnosis not present

## 2017-08-14 DIAGNOSIS — F411 Generalized anxiety disorder: Secondary | ICD-10-CM | POA: Diagnosis not present

## 2017-08-19 ENCOUNTER — Telehealth: Payer: Self-pay | Admitting: *Deleted

## 2017-08-19 ENCOUNTER — Ambulatory Visit (INDEPENDENT_AMBULATORY_CARE_PROVIDER_SITE_OTHER): Payer: BLUE CROSS/BLUE SHIELD | Admitting: Allergy & Immunology

## 2017-08-19 ENCOUNTER — Encounter: Payer: Self-pay | Admitting: Allergy & Immunology

## 2017-08-19 VITALS — BP 126/72 | HR 81 | Temp 98.2°F | Resp 18 | Ht 68.0 in | Wt 241.8 lb

## 2017-08-19 DIAGNOSIS — J302 Other seasonal allergic rhinitis: Secondary | ICD-10-CM | POA: Insufficient documentation

## 2017-08-19 DIAGNOSIS — J3089 Other allergic rhinitis: Secondary | ICD-10-CM | POA: Diagnosis not present

## 2017-08-19 MED ORDER — AZELASTINE-FLUTICASONE 137-50 MCG/ACT NA SUSP: 2.0000 | NASAL | 5 refills | Status: DC

## 2017-08-19 MED ORDER — MONTELUKAST SODIUM 10 MG PO TABS: 10.0000 mg | ORAL_TABLET | Freq: Every day | ORAL | 5 refills | Status: DC

## 2017-08-19 NOTE — Telephone Encounter (Signed)
Dymista pa received. Pa done in nctracks pa approved [phone]

## 2017-08-19 NOTE — Patient Instructions (Addendum)
1. Seasonal and perennial allergic rhinitis - Testing today showed: trees, weeds, grasses, indoor molds, outdoor molds, dust mites, cat, dog and cockroach - Avoidance measures provided. - Continue with: Claritin-D (but try to use it less often since you can have rebound congestion) - Start taking: Singulair (montelukast) 10mg  daily and Dymista (fluticasone/azelastine) two sprays per nostril 1-2 times daily as needed  - Samples of Allegra, Xyzal, and Zyrtec provided (these are stronger antihistamines compared to Claritin).  - You can use an extra dose of the antihistamine, if needed, for breakthrough symptoms.  - Consider nasal saline rinses 1-2 times daily to remove allergens from the nasal cavities as well as help with mucous clearance (this is especially helpful to do before the nasal sprays are given) - Consider allergy shots as a means of long-term control. - Allergy shots "re-train" and "reset" the immune system to ignore environmental allergens and decrease the resulting immune response to those allergens (sneezing, itchy watery eyes, runny nose, nasal congestion, etc).    - Allergy shots improve symptoms in 75-85% of patients.  - Check your insurance to make sure that you are OK with any co-payments and call us back to confirm.  2. Return in about 3 months (around 11/19/2017).  Please inform us of any Emergency Department visits, hospitalizations, or changes in symptoms. Call us before going to the ED for breathing or allergy symptoms since we might be able to fit you in for a sick visit. Feel free to contact us anytime with any questions, problems, or concerns.  It was a pleasure to meet you today! Enjoy the Thanksgiving season!  Websites that have reliable patient information: 1. American Academy of Asthma, Allergy, and Immunology: www.aaaai.org 2. Food Allergy Research and Education (FARE): foodallergy.org 3. Mothers of Asthmatics: http://www.asthmacommunitynetwork.org 4. American  College of Allergy, Asthma, and Immunology: www.acaai.org  Reducing Pollen Exposure  The American Academy of Allergy, Asthma and Immunology suggests the following steps to reduce your exposure to pollen during allergy seasons.    1. Do not hang sheets or clothing out to dry; pollen may collect on these items. 2. Do not mow lawns or spend time around freshly cut grass; mowing stirs up pollen. 3. Keep windows closed at night.  Keep car windows closed while driving. 4. Minimize morning activities outdoors, a time when pollen counts are usually at their highest. 5. Stay indoors as much as possible when pollen counts or humidity is high and on windy days when pollen tends to remain in the air longer. 6. Use air conditioning when possible.  Many air conditioners have filters that trap the pollen spores. 7. Use a HEPA room air filter to remove pollen form the indoor air you breathe.  Control of Mold Allergen   Mold and fungi can grow on a variety of surfaces provided certain temperature and moisture conditions exist.  Outdoor molds grow on plants, decaying vegetation and soil.  The major outdoor mold, Alternaria and Cladosporium, are found in very high numbers during hot and dry conditions.  Generally, a late Summer - Fall peak is seen for common outdoor fungal spores.  Rain will temporarily lower outdoor mold spore count, but counts rise rapidly when the rainy period ends.  The most important indoor molds are Aspergillus and Penicillium.  Dark, humid and poorly ventilated basements are ideal sites for mold growth.  The next most common sites of mold growth are the bathroom and the kitchen.  Outdoor (Seasonal) Mold Control  Positive outdoor molds via skin  testing: Alternaria, Cladosporium, Bipolaris (Helminthsporium), Drechslera (Curvalaria) and Mucor  1. Use air conditioning and keep windows closed 2. Avoid exposure to decaying vegetation. 3. Avoid leaf raking. 4. Avoid grain  handling. 5. Consider wearing a face mask if working in moldy areas.  6.   Indoor (Perennial) Mold Control   Positive indoor molds via skin testing: Aspergillus, Penicillium, Fusarium, Aureobasidium (Pullulara) and Rhizopus  1. Maintain humidity below 50%. 2. Clean washable surfaces with 5% bleach solution. 3. Remove sources e.g. contaminated carpets.  Control of House Dust Mite Allergen    House dust mites play a major role in allergic asthma and rhinitis.  They occur in environments with high humidity wherever human skin, the food for dust mites is found. High levels have been detected in dust obtained from mattresses, pillows, carpets, upholstered furniture, bed covers, clothes and soft toys.  The principal allergen of the house dust mite is found in its feces.  A gram of dust may contain 1,000 mites and 250,000 fecal particles.  Mite antigen is easily measured in the air during house cleaning activities.    1. Encase mattresses, including the box spring, and pillow, in an air tight cover.  Seal the zipper end of the encased mattresses with wide adhesive tape. 2. Wash the bedding in water of 130 degrees Farenheit weekly.  Avoid cotton comforters/quilts and flannel bedding: the most ideal bed covering is the dacron comforter. 3. Remove all upholstered furniture from the bedroom. 4. Remove carpets, carpet padding, rugs, and non-washable window drapes from the bedroom.  Wash drapes weekly or use plastic window coverings. 5. Remove all non-washable stuffed toys from the bedroom.  Wash stuffed toys weekly. 6. Have the room cleaned frequently with a vacuum cleaner and a damp dust-mop.  The patient should not be in a room which is being cleaned and should wait 1 hour after cleaning before going into the room. 7. Close and seal all heating outlets in the bedroom.  Otherwise, the room will become filled with dust-laden air.  An electric heater can be used to heat the room. 8. Reduce indoor  humidity to less than 50%.  Do not use a humidifier.  Control of Dog or Cat Allergen  Avoidance is the best way to manage a dog or cat allergy. If you have a dog or cat and are allergic to dog or cats, consider removing the dog or cat from the home. If you have a dog or cat but don't want to find it a new home, or if your family wants a pet even though someone in the household is allergic, here are some strategies that may help keep symptoms at bay:  1. Keep the pet out of your bedroom and restrict it to only a few rooms. Be advised that keeping the dog or cat in only one room will not limit the allergens to that room. 2. Don't pet, hug or kiss the dog or cat; if you do, wash your hands with soap and water. 3. High-efficiency particulate air (HEPA) cleaners run continuously in a bedroom or living room can reduce allergen levels over time. 4. Regular use of a high-efficiency vacuum cleaner or a central vacuum can reduce allergen levels. 5. Giving your dog or cat a bath at least once a week can reduce airborne allergen.  Control of Cockroach Allergen  Cockroach allergen has been identified as an important cause of acute attacks of asthma, especially in urban settings.  There are fifty-five species of cockroach that  exist in the [place], however only three, the [place], [place] and [place] species produce allergen that can affect patients with Asthma.  Allergens can be obtained from fecal particles, egg casings and secretions from cockroaches.    1. Remove food sources. 2. Reduce access to water. 3. Seal access and entry points. 4. Spray runways with 0.5-1% Diazinon or Chlorpyrifos 5. Blow boric acid power under stoves and refrigerator. 6. Place bait stations (hydramethylnon) at feeding sites.

## 2017-08-19 NOTE — Progress Notes (Addendum)
NEW PATIENT  Date of Service/Encounter:  08/19/17  Referring provider: Sharlene Dory, DO   Assessment:   Seasonal and perennial allergic rhinitis (trees, weeds, grasses, indoor molds, outdoor molds, dust mites, cat, dog and cockroach)  Plan/Recommendations:   1. Seasonal and perennial allergic rhinitis - Testing today showed: trees, weeds, grasses, indoor molds, outdoor molds, dust mites, cat, dog and cockroach - Avoidance measures provided. - Continue with: Claritin-D (but try to use it less often since you can have rebound congestion) - Start taking: Singulair (montelukast) 10mg  daily and Dymista (fluticasone/azelastine) two sprays per nostril 1-2 times daily as needed  - Samples of Allegra, Xyzal, and Zyrtec provided (these are stronger antihistamines compared to Claritin).  - You can use an extra dose of the antihistamine, if needed, for breakthrough symptoms.  - Consider nasal saline rinses 1-2 times daily to remove allergens from the nasal cavities as well as help with mucous clearance (this is especially helpful to do before the nasal sprays are given) - Consider allergy shots as a means of long-term control. - Allergy shots "re-train" and "reset" the immune system to ignore environmental allergens and decrease the resulting immune response to those allergens (sneezing, itchy watery eyes, runny nose, nasal congestion, etc).    - Allergy shots improve symptoms in 75-85% of patients.  - Check your insurance to make sure that you are OK with any co-payments and call us back to confirm.  2. Return in about 3 months (around 11/19/2017).   Subjective:   Kerri Guerra is a 41 y.o. female presenting today for evaluation of  Chief Complaint  Patient presents with  . Allergic Rhinitis   . Allergy Testing    Kerri Guerra has a history of the following: Patient Active Problem List   Diagnosis Date Noted  . Seasonal and perennial allergic rhinitis 08/19/2017  .  Fibromyalgia 07/01/2017  . Anxiety with depression 07/01/2017    History obtained from: chart review and patient.  Kerri Guerra was referred by Sharlene Dory, DO.     Kerri Guerra is a 41 y.o. female presenting for an evaluation of allergic rhinitis. She has had problems since moving from [place] 12 years ago. She moved to [place] (8 years), [place]  (2 years), and then moving to [place] since January 2018. 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 She currently works in the [employer] at [employer].   She reports issues throughout the year. It started more spring related with sinusitis, but then worsened from that point. She has been on Claritin D on 3-4 times per week. This is providing some relief. She does use nose sprays, and has never found them helpful. She has used Flonase, Nasacort, and Nasonex without improvement. She estimates that she needs antibiotics very rarely, maybe once. She does use some OTC eye drops. She has never been allergy tested previously. She does get some mucousy with beers, but she not a big beer drinker; symptoms are somewhat hours removed. She denies asthma and urticaria as well as eczema.   Otherwise, there is no history of other atopic diseases, including asthma, drug allergies, food allergies, environmental allergies, stinging insect allergies, or urticaria. There is no significant infectious history. Vaccinations are up to date.    Past Medical History: Patient Active Problem List   Diagnosis Date Noted  . Seasonal and perennial allergic rhinitis 08/19/2017  . Fibromyalgia 07/01/2017  . Anxiety with depression 07/01/2017    Medication List:  Allergies as of 08/19/2017   No Known Allergies     Medication List        Accurate as of 08/19/17 12:46 PM. Always use your most recent med list.          Azelastine-Fluticasone 137-50 MCG/ACT Susp Commonly known as:  DYMISTA Place 2 sprays 1 day or 1  dose into both nostrils.   B-12 PO Take by mouth.   busPIRone 5 MG tablet Commonly known as:  BUSPAR Take 1 tablet (5 mg total) by mouth 3 (three) times daily.   DULoxetine 30 MG capsule Commonly known as:  CYMBALTA Take 1 capsule (30 mg total) by mouth daily.   ibuprofen 200 MG tablet Commonly known as:  ADVIL,MOTRIN Take 200 mg by mouth every 8 (eight) hours as needed.   lactobacillus acidophilus Tabs tablet Take 1 tablet daily by mouth.   loratadine 10 MG tablet Commonly known as:  CLARITIN Take 10 mg by mouth daily.   montelukast 10 MG tablet Commonly known as:  SINGULAIR Take 1 tablet (10 mg total) at bedtime by mouth.   SPRINTEC 28 0.25-35 MG-MCG tablet Generic drug:  norgestimate-ethinyl estradiol Take 1 tablet by mouth daily.   traZODone 50 MG tablet Commonly known as:  DESYREL Take 0.5-1 tablets (25-50 mg total) by mouth at bedtime as needed for sleep.       Birth History: non-contributory.   Developmental History: non-contributory.   Past Surgical History: Past Surgical History:  Procedure Laterality Date  . COLPOSCOPY       Family History: Family History  Problem Relation Age of Onset  . Cancer Maternal Grandmother   . Cancer Father        PROSTATE  . Diabetes Mother   . Hypertension Mother   . Stroke Neg Hx      Social History: Kerri Guerra lives at home with her boyfriend as well as a Nurse, mental healthdog. She does know that she is allergic to cats and dogs. They live in an apartment. There is carpeting throughout the apartment. They have electric heating and central cooling. There are no dust mite coverings on the bedding. There is tobacco exposure. She has been smoking around one pack per two weeks since 1997; they have recently changed to vaping.     Review of Systems: a 14-point review of systems is pertinent for what is mentioned in HPI.  Otherwise, all other systems were negative. Constitutional: negative other than that listed in the HPI Eyes:  negative other than that listed in the HPI Ears, nose, mouth, throat, and face: negative other than that listed in the HPI Respiratory: negative other than that listed in the HPI Cardiovascular: negative other than that listed in the HPI Gastrointestinal: negative other than that listed in the HPI Genitourinary: negative other than that listed in the HPI Integument: negative other than that listed in the HPI Hematologic: negative other than that listed in the HPI Musculoskeletal: negative other than that listed in the HPI Neurological: negative other than that listed in the HPI Allergy/Immunologic: negative other than that listed in the HPI    Objective:   Blood pressure 126/72, pulse 81, temperature 98.2 F (36.8 C), resp. rate 18, height 5\' 8"  (1.727 m), weight 241 lb 12.8 oz (109.7 kg), SpO2 93 %. Body mass index is 36.77 kg/m.   Physical Exam:  General: Alert, interactive, in no acute distress. Very pleasant female.  Eyes: No conjunctival injection bilaterally, no discharge on the right, no discharge on the left and  no Horner-Trantas dots present. PERRL bilaterally. EOMI without pain. No photophobia.  Ears: Right TM pearly gray with normal light reflex, Left TM pearly gray with normal light reflex, Right TM intact without perforation and Left TM intact without perforation.  Nose/Throat: External nose within normal limits, nasal crease present and septum midline. Turbinates markedly edematous and pale with clear discharge. Posterior oropharynx erythematous with cobblestoning in the posterior oropharynx. Tonsils 2+ without exudates.  Tongue without thrush. Neck: Supple without thyromegaly. Trachea midline. Adenopathy: shoddy bilateral anterior cervical lymphadenopathy and no enlarged lymph nodes appreciated in the occipital, axillary, epitrochlear, inguinal, or popliteal regions. Lungs: Clear to auscultation without wheezing, rhonchi or rales. No increased work of  breathing. CV: Normal S1/S2. No murmurs. Capillary refill <2 seconds.  Abdomen: Nondistended, nontender. No guarding or rebound tenderness. Bowel sounds present in all fields and hypoactive  Skin: Warm and dry, without lesions or rashes. Extremities:  No clubbing, cyanosis or edema. Neuro:   Grossly intact. No focal deficits appreciated. Responsive to questions.  Diagnostic studies:   Allergy Studies:   Indoor/Outdoor Percutaneous Adult Environmental Panel: positive to Df mite, Dp mites, cat and dog. Otherwise negative with adequate controls.  Indoor/Outdoor Selected Intradermal Environmental Panel: positive to [place] grass, Johnson grass, ragweed mix, weed mix, tree mix, mold mix #1, mold mix #2, mold mix #3, mold mix #4, cockroach and mite mix. Otherwise negative with adequate controls.        Malachi BondsJoel Baptiste Littler, MD [facility]

## 2017-08-29 DIAGNOSIS — F4322 Adjustment disorder with anxiety: Secondary | ICD-10-CM | POA: Diagnosis not present

## 2017-09-02 DIAGNOSIS — F411 Generalized anxiety disorder: Secondary | ICD-10-CM | POA: Diagnosis not present

## 2017-09-04 ENCOUNTER — Other Ambulatory Visit: Payer: Self-pay | Admitting: Allergy & Immunology

## 2017-09-04 MED ORDER — LEVOCETIRIZINE DIHYDROCHLORIDE 5 MG PO TABS: 5.0000 mg | ORAL_TABLET | Freq: Every evening | ORAL | 5 refills | Status: DC

## 2017-09-04 NOTE — Telephone Encounter (Signed)
Patient was seen by Dr. Dellis AnesGallagher on 08-19-17 and he gave her a sample of Zyzal. She said it worked and [facility] [address]

## 2017-09-11 DIAGNOSIS — J029 Acute pharyngitis, unspecified: Secondary | ICD-10-CM | POA: Diagnosis not present

## 2017-09-11 DIAGNOSIS — J36 Peritonsillar abscess: Secondary | ICD-10-CM | POA: Diagnosis not present

## 2017-09-12 DIAGNOSIS — F411 Generalized anxiety disorder: Secondary | ICD-10-CM | POA: Diagnosis not present

## 2017-09-14 DIAGNOSIS — F4322 Adjustment disorder with anxiety: Secondary | ICD-10-CM | POA: Diagnosis not present

## 2017-09-25 ENCOUNTER — Other Ambulatory Visit: Payer: Self-pay | Admitting: Family Medicine

## 2017-09-25 DIAGNOSIS — F418 Other specified anxiety disorders: Secondary | ICD-10-CM

## 2017-09-25 DIAGNOSIS — M797 Fibromyalgia: Secondary | ICD-10-CM

## 2017-09-26 DIAGNOSIS — F411 Generalized anxiety disorder: Secondary | ICD-10-CM | POA: Diagnosis not present

## 2017-10-05 DIAGNOSIS — F4322 Adjustment disorder with anxiety: Secondary | ICD-10-CM | POA: Diagnosis not present

## 2017-10-10 DIAGNOSIS — F411 Generalized anxiety disorder: Secondary | ICD-10-CM | POA: Diagnosis not present

## 2017-10-15 DIAGNOSIS — F411 Generalized anxiety disorder: Secondary | ICD-10-CM | POA: Diagnosis not present

## 2017-10-19 DIAGNOSIS — F4322 Adjustment disorder with anxiety: Secondary | ICD-10-CM | POA: Diagnosis not present

## 2017-10-29 DIAGNOSIS — F411 Generalized anxiety disorder: Secondary | ICD-10-CM | POA: Diagnosis not present

## 2017-11-09 ENCOUNTER — Other Ambulatory Visit: Payer: Self-pay | Admitting: Family Medicine

## 2017-11-09 DIAGNOSIS — F418 Other specified anxiety disorders: Secondary | ICD-10-CM

## 2017-11-09 DIAGNOSIS — F4322 Adjustment disorder with anxiety: Secondary | ICD-10-CM | POA: Diagnosis not present

## 2017-11-11 DIAGNOSIS — F411 Generalized anxiety disorder: Secondary | ICD-10-CM | POA: Diagnosis not present

## 2017-11-15 ENCOUNTER — Ambulatory Visit: Payer: BLUE CROSS/BLUE SHIELD | Admitting: Family Medicine

## 2017-11-15 ENCOUNTER — Encounter: Payer: Self-pay | Admitting: Family Medicine

## 2017-11-15 VITALS — BP 110/82 | HR 81 | Temp 98.6°F | Ht 68.0 in | Wt 250.4 lb

## 2017-11-15 DIAGNOSIS — F418 Other specified anxiety disorders: Secondary | ICD-10-CM

## 2017-11-15 DIAGNOSIS — Z23 Encounter for immunization: Secondary | ICD-10-CM

## 2017-11-15 DIAGNOSIS — Z9109 Other allergy status, other than to drugs and biological substances: Secondary | ICD-10-CM

## 2017-11-15 DIAGNOSIS — R5383 Other fatigue: Secondary | ICD-10-CM | POA: Diagnosis not present

## 2017-11-15 MED ORDER — FEXOFENADINE HCL 180 MG PO TABS: 180.0000 mg | ORAL_TABLET | Freq: Every day | ORAL | 5 refills | Status: DC

## 2017-11-15 NOTE — Progress Notes (Signed)
Chief Complaint  Patient presents with  . Fatigue    nasal congestion  . Insomnia    Subjective: Patient is a 42 y.o. female here for fatigue.  This has been going on for several months. She gets very tired during the day. She functions best with 8-9 hrs of sleep. She normally gets 6.5-7. She will sometimes have difficulty staying asleep. She does have a blue light filter on her ipad, but not her iphone. She had a sleep study done in 2017 that was normal. She feels her mood is controlled currently. Pt has been walking and doing some yoga, diet is improving. No areas of easy bruising or bleeding, cycles are not particularly heavy.  Nasal congestion, sneezing, runny nose and mild cough over past week after increased temps. Improving overall, no SOB or fevers.  ROS: Endo: No fevers  Lungs: Denies SOB   Family History  Problem Relation Age of Onset  . Cancer Maternal Grandmother   . Cancer Father        PROSTATE  . Diabetes Mother   . Hypertension Mother   . Stroke Neg Hx    Past Medical History:  Diagnosis Date  . Anxiety with depression 07/01/2017  . Fibromyalgia   . Vaginal Pap smear, abnormal    No Known Allergies  Current Outpatient Medications:  .  busPIRone (BUSPAR) 5 MG tablet, Take 1 tablet (5 mg total) by mouth 2 (two) times daily., Disp: 60 tablet, Rfl: 2 .  DULoxetine (CYMBALTA) 30 MG capsule, TAKE 1 CAPSULE BY MOUTH ONCE DAILY, Disp: 30 capsule, Rfl: 2 .  montelukast (SINGULAIR) 10 MG tablet, Take 1 tablet (10 mg total) at bedtime by mouth., Disp: 30 tablet, Rfl: 5 .  norgestimate-ethinyl estradiol (SPRINTEC 28) 0.25-35 MG-MCG tablet, Take 1 tablet by mouth daily., Disp: , Rfl:  .  fexofenadine (ALLEGRA) 180 MG tablet, Take 1 tablet (180 mg total) by mouth daily., Disp: 30 tablet, Rfl: 5 .  ibuprofen (ADVIL,MOTRIN) 200 MG tablet, Take 200 mg by mouth every 8 (eight) hours as needed., Disp: , Rfl:  .  traZODone (DESYREL) 50 MG tablet, Take 0.5-1 tablets (25-50 mg  total) by mouth at bedtime as needed for sleep., Disp: 30 tablet, Rfl: 2  Objective: BP 110/82 (BP Location: Left Arm, Patient Position: Sitting, Cuff Size: Large)   Pulse 81   Temp 98.6 F (37 C) (Oral)   Ht 5\' 8"  (1.727 m)   Wt 250 lb 6 oz (113.6 kg)   SpO2 97%   BMI 38.07 kg/m  General: Awake, appears stated age HEENT: MMM, EOMi Heart: RRR Lungs: CTAB, no rales, wheezes or rhonchi. No accessory muscle use Abd: BS+, soft, NT, ND, no masses or organomegaly Psych: Age appropriate judgment and insight, normal affect and mood  Assessment and Plan: Fatigue, unspecified type - Plan: TSH, CBC, Comprehensive metabolic panel, Vitamin D (25 hydroxy)  Anxiety with depression - Plan: busPIRone (BUSPAR) 5 MG tablet  Environmental allergies - Plan: fexofenadine (ALLEGRA) 180 MG tablet  Need for influenza vaccination - Plan: Flu Vaccine QUAD 6+ mos PF IM (Fluarix Quad PF)  Orders as above. Ck labs. Counseled on diet and exercise. Nighttime mode for all electronics before bed. Change Xyzal to Allegra as this could be inducing drowsiness. For allergies, may need to start back on Dymista.  F/u prn.  The patient voiced understanding and agreement to the plan.  Jilda Rocheicholas Paul Brown StationWendling, DO 11/15/17  5:07 PM

## 2017-11-15 NOTE — Progress Notes (Signed)
Pre visit review using our clinic review tool, if applicable. No additional management support is needed unless otherwise documented below in the visit note. 

## 2017-11-15 NOTE — Patient Instructions (Addendum)
Allegra (fexofenadine) is available OTC. Generic name is in parenthesis.   You might need to go back on the Dymista.   Give me a couple business days to get the results of your labs back.   Consider lifting weights.  Keep moving!  Keep the diet clean.   Ask your fiance if he notices you stop breathing at night.  Let us know if you need anything.

## 2017-11-16 LAB — CBC
HCT: 38.9 % (ref 35.0–45.0)
Hemoglobin: 13.1 g/dL (ref 11.7–15.5)
MCH: 28.1 pg (ref 27.0–33.0)
MCHC: 33.7 g/dL (ref 32.0–36.0)
MCV: 83.5 fL (ref 80.0–100.0)
MPV: 11.1 fL (ref 7.5–12.5)
PLATELETS: 301 10*3/uL (ref 140–400)
RBC: 4.66 10*6/uL (ref 3.80–5.10)
RDW: 13.2 % (ref 11.0–15.0)
WBC: 4.1 10*3/uL (ref 3.8–10.8)

## 2017-11-16 LAB — COMPREHENSIVE METABOLIC PANEL
AG Ratio: 1.6 (calc) (ref 1.0–2.5)
ALKALINE PHOSPHATASE (APISO): 64 U/L (ref 33–115)
ALT: 11 U/L (ref 6–29)
AST: 16 U/L (ref 10–30)
Albumin: 3.9 g/dL (ref 3.6–5.1)
BUN: 11 mg/dL (ref 7–25)
CHLORIDE: 106 mmol/L (ref 98–110)
CO2: 24 mmol/L (ref 20–32)
CREATININE: 1.06 mg/dL (ref 0.50–1.10)
Calcium: 9.2 mg/dL (ref 8.6–10.2)
GLOBULIN: 2.5 g/dL (ref 1.9–3.7)
Glucose, Bld: 84 mg/dL (ref 65–99)
Potassium: 4.6 mmol/L (ref 3.5–5.3)
Sodium: 140 mmol/L (ref 135–146)
Total Bilirubin: 0.2 mg/dL (ref 0.2–1.2)
Total Protein: 6.4 g/dL (ref 6.1–8.1)

## 2017-11-16 LAB — TSH: TSH: 1.31 mIU/L

## 2017-11-16 LAB — VITAMIN D 25 HYDROXY (VIT D DEFICIENCY, FRACTURES): Vit D, 25-Hydroxy: 22 ng/mL — ABNORMAL LOW (ref 30–100)

## 2017-11-18 ENCOUNTER — Other Ambulatory Visit: Payer: Self-pay | Admitting: Family Medicine

## 2017-11-18 DIAGNOSIS — E559 Vitamin D deficiency, unspecified: Secondary | ICD-10-CM

## 2017-11-23 DIAGNOSIS — F4322 Adjustment disorder with anxiety: Secondary | ICD-10-CM | POA: Diagnosis not present

## 2017-12-05 DIAGNOSIS — F411 Generalized anxiety disorder: Secondary | ICD-10-CM | POA: Diagnosis not present

## 2017-12-07 DIAGNOSIS — F4322 Adjustment disorder with anxiety: Secondary | ICD-10-CM | POA: Diagnosis not present

## 2017-12-19 DIAGNOSIS — F411 Generalized anxiety disorder: Secondary | ICD-10-CM | POA: Diagnosis not present

## 2017-12-21 DIAGNOSIS — F4322 Adjustment disorder with anxiety: Secondary | ICD-10-CM | POA: Diagnosis not present

## 2017-12-25 ENCOUNTER — Other Ambulatory Visit: Payer: Self-pay | Admitting: Family Medicine

## 2017-12-25 DIAGNOSIS — F418 Other specified anxiety disorders: Secondary | ICD-10-CM

## 2017-12-25 DIAGNOSIS — M797 Fibromyalgia: Secondary | ICD-10-CM

## 2018-01-25 DIAGNOSIS — F4322 Adjustment disorder with anxiety: Secondary | ICD-10-CM | POA: Diagnosis not present

## 2018-02-12 ENCOUNTER — Other Ambulatory Visit: Payer: Self-pay | Admitting: Family Medicine

## 2018-02-12 ENCOUNTER — Other Ambulatory Visit (INDEPENDENT_AMBULATORY_CARE_PROVIDER_SITE_OTHER): Payer: BLUE CROSS/BLUE SHIELD

## 2018-02-12 DIAGNOSIS — E559 Vitamin D deficiency, unspecified: Secondary | ICD-10-CM

## 2018-02-12 LAB — VITAMIN D 25 HYDROXY (VIT D DEFICIENCY, FRACTURES): VITD: 21.89 ng/mL — ABNORMAL LOW (ref 30.00–100.00)

## 2018-02-12 MED ORDER — VITAMIN D (ERGOCALCIFEROL) 1.25 MG (50000 UNIT) PO CAPS: 50000.0000 [IU] | ORAL_CAPSULE | ORAL | 0 refills | Status: DC

## 2018-02-15 DIAGNOSIS — F4322 Adjustment disorder with anxiety: Secondary | ICD-10-CM | POA: Diagnosis not present

## 2018-02-25 ENCOUNTER — Other Ambulatory Visit: Payer: Self-pay | Admitting: Family Medicine

## 2018-02-25 ENCOUNTER — Other Ambulatory Visit: Payer: Self-pay | Admitting: Allergy & Immunology

## 2018-02-25 DIAGNOSIS — F418 Other specified anxiety disorders: Secondary | ICD-10-CM

## 2018-02-26 NOTE — Telephone Encounter (Signed)
Gave 1 courtesy refill of montelukast. No additional refills until pt makes apt.

## 2018-03-01 DIAGNOSIS — F4322 Adjustment disorder with anxiety: Secondary | ICD-10-CM | POA: Diagnosis not present

## 2018-03-15 DIAGNOSIS — F4322 Adjustment disorder with anxiety: Secondary | ICD-10-CM | POA: Diagnosis not present

## 2018-03-31 ENCOUNTER — Other Ambulatory Visit: Payer: Self-pay | Admitting: Family Medicine

## 2018-03-31 ENCOUNTER — Telehealth: Payer: Self-pay

## 2018-03-31 ENCOUNTER — Other Ambulatory Visit: Payer: Self-pay | Admitting: Allergy & Immunology

## 2018-03-31 DIAGNOSIS — M797 Fibromyalgia: Secondary | ICD-10-CM

## 2018-03-31 DIAGNOSIS — F418 Other specified anxiety disorders: Secondary | ICD-10-CM

## 2018-03-31 NOTE — Telephone Encounter (Signed)
Pt needs an appt before a refill can be given. 

## 2018-03-31 NOTE — Telephone Encounter (Signed)
Last refill 07/01/2017  #30 with 2 refills Last OV 07/01/2017

## 2018-03-31 NOTE — Telephone Encounter (Signed)
Pt needs an appt before a refill can be given.

## 2018-04-12 DIAGNOSIS — F4322 Adjustment disorder with anxiety: Secondary | ICD-10-CM | POA: Diagnosis not present

## 2018-05-03 DIAGNOSIS — F4322 Adjustment disorder with anxiety: Secondary | ICD-10-CM | POA: Diagnosis not present

## 2018-05-15 ENCOUNTER — Other Ambulatory Visit (INDEPENDENT_AMBULATORY_CARE_PROVIDER_SITE_OTHER): Payer: BLUE CROSS/BLUE SHIELD

## 2018-05-15 ENCOUNTER — Encounter: Payer: Self-pay | Admitting: Family Medicine

## 2018-05-15 DIAGNOSIS — E559 Vitamin D deficiency, unspecified: Secondary | ICD-10-CM

## 2018-05-15 LAB — VITAMIN D 25 HYDROXY (VIT D DEFICIENCY, FRACTURES): VITD: 37.88 ng/mL (ref 30.00–100.00)

## 2018-05-24 DIAGNOSIS — F4322 Adjustment disorder with anxiety: Secondary | ICD-10-CM | POA: Diagnosis not present

## 2018-06-06 ENCOUNTER — Other Ambulatory Visit: Payer: Self-pay | Admitting: Family Medicine

## 2018-06-06 DIAGNOSIS — F418 Other specified anxiety disorders: Secondary | ICD-10-CM

## 2018-06-07 DIAGNOSIS — F4322 Adjustment disorder with anxiety: Secondary | ICD-10-CM | POA: Diagnosis not present

## 2018-06-14 DIAGNOSIS — B373 Candidiasis of vulva and vagina: Secondary | ICD-10-CM | POA: Diagnosis not present

## 2018-07-02 ENCOUNTER — Other Ambulatory Visit: Payer: Self-pay | Admitting: Family Medicine

## 2018-07-02 DIAGNOSIS — F418 Other specified anxiety disorders: Secondary | ICD-10-CM

## 2018-07-02 DIAGNOSIS — M797 Fibromyalgia: Secondary | ICD-10-CM

## 2018-07-25 ENCOUNTER — Encounter: Payer: Self-pay | Admitting: Family Medicine

## 2018-07-25 ENCOUNTER — Ambulatory Visit: Payer: BLUE CROSS/BLUE SHIELD | Admitting: Family Medicine

## 2018-07-25 VITALS — BP 109/71 | HR 70 | Ht 67.0 in | Wt 235.0 lb

## 2018-07-25 DIAGNOSIS — R87610 Atypical squamous cells of undetermined significance on cytologic smear of cervix (ASC-US): Secondary | ICD-10-CM | POA: Diagnosis not present

## 2018-07-25 DIAGNOSIS — Z3009 Encounter for other general counseling and advice on contraception: Secondary | ICD-10-CM | POA: Diagnosis not present

## 2018-07-25 DIAGNOSIS — R87810 Cervical high risk human papillomavirus (HPV) DNA test positive: Secondary | ICD-10-CM | POA: Diagnosis not present

## 2018-07-25 NOTE — Progress Notes (Signed)
   Subjective:    Patient ID: Kerri Guerra, female    DOB: 1976/08/08, 42 y.o.   MRN: [id number]  HPI  Patient presents for preconception counseling. She recently became married. Her husband has two children from previous relationship. She is attempting to lose weight - keto diet and exercise. She recently started to take PNV.   Regular menses. 28-30 day interval.   Review of Systems  All other systems reviewed and are negative.      Objective:   Physical Exam  Constitutional: She is oriented to person, place, and time. She appears well-developed and well-nourished.  Neurological: She is alert and oriented to person, place, and time.  Skin: Skin is warm and dry.  Psychiatric: She has a normal mood and affect. Her behavior is normal. Judgment and thought content normal.      Assessment & Plan:  1. Family planning counseling Continue with PNV. 22 minutes of counseling done, including regarding AMA, fertility questions.  2. ASCUS with positive high risk HPV cervical Patient never returned for colposcopy. At this point, patient to return for annual exam and PAP.

## 2018-08-05 DIAGNOSIS — F4322 Adjustment disorder with anxiety: Secondary | ICD-10-CM | POA: Diagnosis not present

## 2018-08-09 DIAGNOSIS — F4322 Adjustment disorder with anxiety: Secondary | ICD-10-CM | POA: Diagnosis not present

## 2018-08-19 DIAGNOSIS — F4322 Adjustment disorder with anxiety: Secondary | ICD-10-CM | POA: Diagnosis not present

## 2018-08-23 DIAGNOSIS — F4322 Adjustment disorder with anxiety: Secondary | ICD-10-CM | POA: Diagnosis not present

## 2018-08-26 DIAGNOSIS — F4322 Adjustment disorder with anxiety: Secondary | ICD-10-CM | POA: Diagnosis not present

## 2018-09-04 ENCOUNTER — Other Ambulatory Visit: Payer: Self-pay | Admitting: Allergy & Immunology

## 2018-09-04 ENCOUNTER — Other Ambulatory Visit: Payer: Self-pay | Admitting: Family Medicine

## 2018-09-04 DIAGNOSIS — F418 Other specified anxiety disorders: Secondary | ICD-10-CM

## 2018-09-05 NOTE — Telephone Encounter (Signed)
Pt. requesting buspar and trazodone rx refills. Buspar last refilled 11/15/2017  by 'historical provider'; trazodone last refilled 03/31/18 by Dr. Carmelia RollerWendling, with LOV 2/15. Orders left pended for Dr. Carmelia RollerWendling review.

## 2018-09-08 NOTE — Telephone Encounter (Signed)
Refills sent, please schedule med ck visit. TY.

## 2018-09-09 DIAGNOSIS — F4322 Adjustment disorder with anxiety: Secondary | ICD-10-CM | POA: Diagnosis not present

## 2018-09-10 ENCOUNTER — Other Ambulatory Visit: Payer: Self-pay | Admitting: Allergy & Immunology

## 2018-09-13 ENCOUNTER — Other Ambulatory Visit: Payer: Self-pay | Admitting: Allergy & Immunology

## 2018-09-13 DIAGNOSIS — F4322 Adjustment disorder with anxiety: Secondary | ICD-10-CM | POA: Diagnosis not present

## 2018-09-16 ENCOUNTER — Other Ambulatory Visit: Payer: Self-pay | Admitting: Allergy & Immunology

## 2018-09-19 ENCOUNTER — Ambulatory Visit: Payer: BLUE CROSS/BLUE SHIELD | Admitting: Family Medicine

## 2018-09-19 ENCOUNTER — Encounter: Payer: Self-pay | Admitting: Family Medicine

## 2018-09-19 VITALS — BP 120/78 | HR 75 | Resp 16

## 2018-09-19 DIAGNOSIS — J3089 Other allergic rhinitis: Secondary | ICD-10-CM | POA: Diagnosis not present

## 2018-09-19 DIAGNOSIS — J302 Other seasonal allergic rhinitis: Secondary | ICD-10-CM

## 2018-09-19 MED ORDER — MONTELUKAST SODIUM 10 MG PO TABS: ORAL_TABLET | ORAL | 11 refills | Status: DC

## 2018-09-19 NOTE — Patient Instructions (Addendum)
Allergic rhinitis Continue montelukast 10 mg once a day  1. Seasonal and perennial allergic rhinitis ( trees, weeds, grasses, indoor molds, outdoor molds, dust mites, cat, dog and cockroach) - Avoidance measures provided. - Continue with:   Montelukast 10 mg once a day.  Flonase nasal spray 1-2 sprays once a day aa needed for a stuffy nose Azelastine 2 sprays in each nostril twice a day as needed for a runny nose  - Samples of Allegra and Xyzal provided (these are stronger antihistamines compared to Claritin).  - You can use an extra dose of the antihistamine, if needed, for breakthrough symptoms.  - Consider nasal saline rinses 1-2 times daily to remove allergens from the nasal cavities as well as help with mucous clearance (this is especially helpful to do before the nasal sprays are given) - Consider allergy shots as a means of long-term control. - Allergy shots "re-train" and "reset" the immune system to ignore environmental allergens and decrease the resulting immune response to those allergens (sneezing, itchy watery eyes, runny nose, nasal congestion, etc).    - Allergy shots improve symptoms in 75-85% of patients.  - Check your insurance to make sure that you are OK with any co-payments and call us back to confirm.  2. Follow up in 1 year or sooner if needed  Please inform us of any Emergency Department visits, hospitalizations, or changes in symptoms. Call us before going to the ED for breathing or allergy symptoms since we might be able to fit you in for a sick visit. Feel free to contact us anytime with any questions, problems, or concerns.    Websites that have reliable patient information: 1. American Academy of Asthma, Allergy, and Immunology: www.aaaai.org 2. Food Allergy Research and Education (FARE): foodallergy.org 3. Mothers of Asthmatics: http://www.asthmacommunitynetwork.org 4. American College of Allergy, Asthma, and Immunology: www.acaai.org  Reducing Pollen  Exposure  The American Academy of Allergy, Asthma and Immunology suggests the following steps to reduce your exposure to pollen during allergy seasons.    1. Do not hang sheets or clothing out to dry; pollen may collect on these items. 2. Do not mow lawns or spend time around freshly cut grass; mowing stirs up pollen. 3. Keep windows closed at night.  Keep car windows closed while driving. 4. Minimize morning activities outdoors, a time when pollen counts are usually at their highest. 5. Stay indoors as much as possible when pollen counts or humidity is high and on windy days when pollen tends to remain in the air longer. 6. Use air conditioning when possible.  Many air conditioners have filters that trap the pollen spores. 7. Use a HEPA room air filter to remove pollen form the indoor air you breathe.  Control of Mold Allergen   Mold and fungi can grow on a variety of surfaces provided certain temperature and moisture conditions exist.  Outdoor molds grow on plants, decaying vegetation and soil.  The major outdoor mold, Alternaria and Cladosporium, are found in very high numbers during hot and dry conditions.  Generally, a late Summer - Fall peak is seen for common outdoor fungal spores.  Rain will temporarily lower outdoor mold spore count, but counts rise rapidly when the rainy period ends.  The most important indoor molds are Aspergillus and Penicillium.  Dark, humid and poorly ventilated basements are ideal sites for mold growth.  The next most common sites of mold growth are the bathroom and the kitchen.  Outdoor (Seasonal) Mold Control  Positive outdoor molds  via skin testing: Alternaria, Cladosporium, Bipolaris (Helminthsporium), Drechslera (Curvalaria) and Mucor  1. Use air conditioning and keep windows closed 2. Avoid exposure to decaying vegetation. 3. Avoid leaf raking. 4. Avoid grain handling. 5. Consider wearing a face mask if working in moldy areas.  6.   Indoor (Perennial)  Mold Control   Positive indoor molds via skin testing: Aspergillus, Penicillium, Fusarium, Aureobasidium (Pullulara) and Rhizopus  1. Maintain humidity below 50%. 2. Clean washable surfaces with 5% bleach solution. 3. Remove sources e.g. contaminated carpets.  Control of House Dust Mite Allergen    House dust mites play a major role in allergic asthma and rhinitis.  They occur in environments with high humidity wherever human skin, the food for dust mites is found. High levels have been detected in dust obtained from mattresses, pillows, carpets, upholstered furniture, bed covers, clothes and soft toys.  The principal allergen of the house dust mite is found in its feces.  A gram of dust may contain 1,000 mites and 250,000 fecal particles.  Mite antigen is easily measured in the air during house cleaning activities.    1. Encase mattresses, including the box spring, and pillow, in an air tight cover.  Seal the zipper end of the encased mattresses with wide adhesive tape. 2. Wash the bedding in water of 130 degrees Farenheit weekly.  Avoid cotton comforters/quilts and flannel bedding: the most ideal bed covering is the dacron comforter. 3. Remove all upholstered furniture from the bedroom. 4. Remove carpets, carpet padding, rugs, and non-washable window drapes from the bedroom.  Wash drapes weekly or use plastic window coverings. 5. Remove all non-washable stuffed toys from the bedroom.  Wash stuffed toys weekly. 6. Have the room cleaned frequently with a vacuum cleaner and a damp dust-mop.  The patient should not be in a room which is being cleaned and should wait 1 hour after cleaning before going into the room. 7. Close and seal all heating outlets in the bedroom.  Otherwise, the room will become filled with dust-laden air.  An electric heater can be used to heat the room. 8. Reduce indoor humidity to less than 50%.  Do not use a humidifier.  Control of Dog or Cat Allergen  Avoidance is  the best way to manage a dog or cat allergy. If you have a dog or cat and are allergic to dog or cats, consider removing the dog or cat from the home. If you have a dog or cat but don't want to find it a new home, or if your family wants a pet even though someone in the household is allergic, here are some strategies that may help keep symptoms at bay:  1. Keep the pet out of your bedroom and restrict it to only a few rooms. Be advised that keeping the dog or cat in only one room will not limit the allergens to that room. 2. Don't pet, hug or kiss the dog or cat; if you do, wash your hands with soap and water. 3. High-efficiency particulate air (HEPA) cleaners run continuously in a bedroom or living room can reduce allergen levels over time. 4. Regular use of a high-efficiency vacuum cleaner or a central vacuum can reduce allergen levels. 5. Giving your dog or cat a bath at least once a week can reduce airborne allergen.  Control of Cockroach Allergen  Cockroach allergen has been identified as an important cause of acute attacks of asthma, especially in urban settings.  There are fifty-five species of  cockroach that exist in the [place], however only three, the [place], [place] and Oriental species produce allergen that can affect patients with Asthma.  Allergens can be obtained from fecal particles, egg casings and secretions from cockroaches.    1. Remove food sources. 2. Reduce access to water. 3. Seal access and entry points. 4. Spray runways with 0.5-1% Diazinon or Chlorpyrifos 5. Blow boric acid power under stoves and refrigerator. 6. Place bait stations (hydramethylnon) at feeding sites.

## 2018-09-19 NOTE — Progress Notes (Addendum)
[address] [place] [id number] Dept: [phone]  FOLLOW UP NOTE  Patient ID: Kerri Guerra, female    DOB: [birth year]  Age: 42 y.o. MRN: [id number] Date of Office Visit: 09/19/2018  Assessment  Chief Complaint: Allergic Rhinitis   HPI Kerri Guerra is a 42 year old female who presents to the clinic for a follow up visit. She was last seen in this clinic on 08/19/2018 by Dr. Dellis AnesGallagher for evaluation of allergic rhinitis. At that time, her skin testing was positive to grass, weed, and tree pollens, molds, dust mites, cat, dog, and cockroach. She began montelukast 10 mg once a day and loratadine. She reported the loratadine made her sleepy and changed to fexofenadine. This provided excellent results until about a month ago when she reports the fexofenadine has become less effective. She is not currently using nasal sprays or saline nasal rinses. Her current medications are listed in the chart.    Drug Allergies:  No Known Allergies  Physical Exam: BP 120/78 (BP Location: Left Arm, Patient Position: Sitting, Cuff Size: Normal)   Pulse 75   Resp 16    Physical Exam Constitutional:      Appearance: Normal appearance.  HENT:     Head: Normocephalic and atraumatic.     Right Ear: Tympanic membrane normal.     Left Ear: Tympanic membrane normal.     Nose: Nose normal.     Comments: Bilateral nares slightly erythematous with clear nasal drainage noted. Pharynx slightly erythematous with no exudates or tonsillolith noted. Ears normal. Eyes normal.    Mouth/Throat:     Pharynx: Oropharynx is clear.  Eyes:     Conjunctiva/sclera: Conjunctivae normal.  Neck:     Musculoskeletal: Normal range of motion and neck supple.  Cardiovascular:     Rate and Rhythm: Normal rate and regular rhythm.     Heart sounds: Normal heart sounds. No murmur.  Pulmonary:     Effort: Pulmonary effort is normal.     Breath sounds: Normal breath sounds.     Comments: Lungs clear to  auscultation Musculoskeletal: Normal range of motion.  Skin:    General: Skin is warm and dry.  Neurological:     Mental Status: She is alert and oriented to person, place, and time.  Psychiatric:        Mood and Affect: Mood normal.        Behavior: Behavior normal.        Thought Content: Thought content normal.        Judgment: Judgment normal.      Assessment and Plan: 1. Seasonal and perennial allergic rhinitis   ( trees, weeds, grasses, indoor molds, outdoor molds, dust mites, cat, dog and cockroach) - Avoidance measures provided. - Continue with:   - Montelukast 10 mg once a day.  - Flonase nasal spray 1-2 sprays once a day aa needed for a stuffy nose - Azelastine 2 sprays in each nostril twice a day as needed for a runny nose  - Samples of Allegra and Xyzal provided (these are stronger antihistamines compared to Claritin).  - You can use an extra dose of the antihistamine, if needed, for breakthrough symptoms.  - Consider nasal saline rinses 1-2 times daily to remove allergens from the nasal cavities as well as help with mucous clearance (this is especially helpful to do before the nasal sprays are given) - Consider allergy shots as a means of long-term control. - Allergy shots "re-train" and "reset" the immune  system to ignore environmental allergens and decrease the resulting immune response to those allergens (sneezing, itchy watery eyes, runny nose, nasal congestion, etc).    - Allergy shots improve symptoms in 75-85% of patients.  - Check your insurance to make sure that you are OK with any co-payments and call us back to confirm.  2. Follow up in 1 year or sooner if needed  Please inform us of any Emergency Department visits, hospitalizations, or changes in symptoms. Call us before going to the ED for breathing or allergy symptoms since we might be able to fit you in for a sick visit. Feel free to contact us anytime with any questions, problems, or concerns.  Return in  about 1 year (around 09/20/2019), or if symptoms worsen or fail to improve.    Thank you for the opportunity to care for this patient.  Please do not hesitate to contact me with questions.  Thermon LeylandAnne Pegge Cumberledge, FNP [facility]  Attestation:  I reviewed the Nurse Practitioner's note and agree with the documented findings and plan of care. We discussed the patient and developed a plan concurrently.   Margo AyeShaylar Padgett, MD [facility]

## 2018-10-06 ENCOUNTER — Other Ambulatory Visit: Payer: Self-pay | Admitting: Family Medicine

## 2018-10-06 DIAGNOSIS — M797 Fibromyalgia: Secondary | ICD-10-CM

## 2018-10-06 DIAGNOSIS — F418 Other specified anxiety disorders: Secondary | ICD-10-CM

## 2018-10-11 DIAGNOSIS — F4322 Adjustment disorder with anxiety: Secondary | ICD-10-CM | POA: Diagnosis not present

## 2018-10-22 ENCOUNTER — Encounter: Payer: Self-pay | Admitting: Podiatry

## 2018-10-22 ENCOUNTER — Ambulatory Visit: Payer: BLUE CROSS/BLUE SHIELD | Admitting: Podiatry

## 2018-10-22 VITALS — BP 112/88 | HR 77

## 2018-10-22 DIAGNOSIS — B353 Tinea pedis: Secondary | ICD-10-CM

## 2018-10-22 DIAGNOSIS — N760 Acute vaginitis: Secondary | ICD-10-CM

## 2018-10-22 DIAGNOSIS — R0609 Other forms of dyspnea: Secondary | ICD-10-CM

## 2018-10-22 DIAGNOSIS — R55 Syncope and collapse: Secondary | ICD-10-CM | POA: Insufficient documentation

## 2018-10-22 DIAGNOSIS — R0600 Dyspnea, unspecified: Secondary | ICD-10-CM | POA: Insufficient documentation

## 2018-10-22 DIAGNOSIS — R931 Abnormal findings on diagnostic imaging of heart and coronary circulation: Secondary | ICD-10-CM | POA: Insufficient documentation

## 2018-10-22 DIAGNOSIS — B9689 Other specified bacterial agents as the cause of diseases classified elsewhere: Secondary | ICD-10-CM | POA: Insufficient documentation

## 2018-10-22 DIAGNOSIS — R002 Palpitations: Secondary | ICD-10-CM | POA: Insufficient documentation

## 2018-10-22 DIAGNOSIS — N870 Mild cervical dysplasia: Secondary | ICD-10-CM | POA: Insufficient documentation

## 2018-10-22 MED ORDER — TERBINAFINE HCL 250 MG PO TABS: 250.0000 mg | ORAL_TABLET | Freq: Every day | ORAL | 0 refills | Status: DC

## 2018-10-22 MED ORDER — CLOTRIMAZOLE-BETAMETHASONE 1-0.05 % EX CREA: 1.0000 "application " | TOPICAL_CREAM | Freq: Two times a day (BID) | CUTANEOUS | 1 refills | Status: DC

## 2018-10-25 LAB — WOUND CULTURE
MICRO NUMBER:: 89369
RESULT: NO GROWTH
SPECIMEN QUALITY:: ADEQUATE

## 2018-10-30 DIAGNOSIS — F4322 Adjustment disorder with anxiety: Secondary | ICD-10-CM | POA: Diagnosis not present

## 2018-11-02 NOTE — Progress Notes (Signed)
   HPI: 43 year old female presenting today as a new patient with a chief complaint of pain to the medial plantar left foot that began about 3 weeks ago. She believes she may have a plantar wart and states walking on the foot increases the discomfort. She has not done anything for treatment. Patient is here for further evaluation and treatment.   Past Medical History:  Diagnosis Date  . Anxiety with depression 07/01/2017  . Depression   . Fibromyalgia   . Vaginal Pap smear, abnormal      Physical Exam: General: The patient is alert and oriented x3 in no acute distress.  Dermatology: Pruritus to the left arch with hyperkeratosis. Skin is warm, dry and supple bilateral lower extremities. Negative for open lesions or macerations.  Vascular: Palpable pedal pulses bilaterally. No edema or erythema noted. Capillary refill within normal limits.  Neurological: Epicritic and protective threshold grossly intact bilaterally.   Musculoskeletal Exam: Range of motion within normal limits to all pedal and ankle joints bilateral. Muscle strength 5/5 in all groups bilateral.   Assessment: 1. Tinea pedis left arch   Plan of Care:  1. Patient evaluated.   2. Prescription for Lotrisone cream provided to patient.  3. Prescription for Lamisil 250 mg provided to patient.  4. Return to clinic in 4 weeks.      Felecia Shelling, DPM [facility]  Dr. Felecia Shelling, DPM    [address] [place], [place] [address]                Office [phone]  Fax [phone]

## 2018-11-06 DIAGNOSIS — Z713 Dietary counseling and surveillance: Secondary | ICD-10-CM | POA: Diagnosis not present

## 2018-11-06 DIAGNOSIS — N771 Vaginitis, vulvitis and vulvovaginitis in diseases classified elsewhere: Secondary | ICD-10-CM | POA: Diagnosis not present

## 2018-11-12 ENCOUNTER — Other Ambulatory Visit: Payer: Self-pay | Admitting: Family Medicine

## 2018-11-12 DIAGNOSIS — F418 Other specified anxiety disorders: Secondary | ICD-10-CM

## 2018-11-12 DIAGNOSIS — M797 Fibromyalgia: Secondary | ICD-10-CM

## 2018-11-21 ENCOUNTER — Ambulatory Visit: Payer: BLUE CROSS/BLUE SHIELD | Admitting: Podiatry

## 2018-11-27 DIAGNOSIS — F4322 Adjustment disorder with anxiety: Secondary | ICD-10-CM | POA: Diagnosis not present

## 2018-12-08 ENCOUNTER — Other Ambulatory Visit: Payer: Self-pay | Admitting: Family Medicine

## 2018-12-08 DIAGNOSIS — F4322 Adjustment disorder with anxiety: Secondary | ICD-10-CM | POA: Diagnosis not present

## 2018-12-08 DIAGNOSIS — M797 Fibromyalgia: Secondary | ICD-10-CM

## 2018-12-08 DIAGNOSIS — F418 Other specified anxiety disorders: Secondary | ICD-10-CM

## 2018-12-10 ENCOUNTER — Encounter: Payer: Self-pay | Admitting: Nurse Practitioner

## 2018-12-10 ENCOUNTER — Other Ambulatory Visit: Payer: Self-pay

## 2018-12-10 ENCOUNTER — Ambulatory Visit: Payer: BLUE CROSS/BLUE SHIELD | Admitting: Nurse Practitioner

## 2018-12-10 VITALS — BP 129/86 | HR 75 | Temp 98.7°F | Resp 16 | Wt 251.2 lb

## 2018-12-10 DIAGNOSIS — L299 Pruritus, unspecified: Secondary | ICD-10-CM

## 2018-12-10 DIAGNOSIS — J302 Other seasonal allergic rhinitis: Secondary | ICD-10-CM

## 2018-12-10 NOTE — Progress Notes (Signed)
   Subjective:    Patient ID: Kerri Guerra, female    DOB: 1976-07-28, 43 y.o.   MRN: [id number]  HPI Kerri Guerra comes [facility] the [facility] with c/o waking up yesterday with a sore throat. She reports bilateral ear itching. Denies fever, facial pain or pressure, ear pain or cough. Admits some nausea but denies vomiting or diarrhea. She does reports hx of allergies and takes Zyrtec or Allegra rotating and Singulair daily. No other self treatment. Followed by allergist.     Review of Systems  HENT: Positive for sore throat. Negative for ear pain, sinus pressure and sinus pain.   Respiratory: Negative for cough, shortness of breath and wheezing.   Cardiovascular: Negative for chest pain.  Gastrointestinal: Positive for nausea. Negative for diarrhea and vomiting.       Objective:   Physical Exam Vitals signs reviewed.  Constitutional:      Appearance: She is well-developed.  HENT:     Head: Normocephalic and atraumatic.     Comments: No maxillary sinus tenderness. Some frontal sinus tenderness.    Ears:     Comments: Bilateral ears with serous fluid and bubbles, no erythema or bulging to intact TM     Mouth/Throat:     Mouth: Mucous membranes are moist. No oral lesions.     Pharynx: No pharyngeal swelling, oropharyngeal exudate or posterior oropharyngeal erythema.  Neck:     Musculoskeletal: Normal range of motion and neck supple.     Comments: Bilateral upper anterior cervical lymphadenopathy with some tenderness Cardiovascular:     Rate and Rhythm: Normal rate and regular rhythm.     Heart sounds: Normal heart sounds.  Pulmonary:     Effort: Pulmonary effort is normal. No respiratory distress.     Breath sounds: Normal breath sounds. No wheezing or rhonchi.  Abdominal:     General: Bowel sounds are normal.     Palpations: Abdomen is soft.  Musculoskeletal: Normal range of motion.  Skin:    General: Skin is warm and dry.  Neurological:     Mental Status:  She is alert and oriented to person, place, and time.  Psychiatric:        Mood and Affect: Mood normal.           Assessment & Plan:

## 2018-12-10 NOTE — Patient Instructions (Addendum)
5 samples of Zyrtec 10mg  and 5 samples of Diphenhydramine 25 mg given today for symptoms; take these as instructed and hold Trazodone while taking Diphenhydramine Fluids, rest discussed and encouraged Continue Singulair as directed and follow up with Allergist as needed or recommended Encouraged patient to call the office or primary care doctor for an appointment if no improvement in symptoms or if symptoms change or worsen after 72 hours of planned treatment. Patient verbalized understanding of all instructions given/reviewed and has no further questions or concerns at this time.

## 2018-12-14 ENCOUNTER — Other Ambulatory Visit: Payer: Self-pay | Admitting: Family Medicine

## 2018-12-14 DIAGNOSIS — F418 Other specified anxiety disorders: Secondary | ICD-10-CM

## 2018-12-25 ENCOUNTER — Other Ambulatory Visit: Payer: Self-pay | Admitting: Family Medicine

## 2018-12-25 DIAGNOSIS — M797 Fibromyalgia: Secondary | ICD-10-CM

## 2018-12-25 DIAGNOSIS — F418 Other specified anxiety disorders: Secondary | ICD-10-CM

## 2019-01-03 ENCOUNTER — Other Ambulatory Visit: Payer: Self-pay | Admitting: Family Medicine

## 2019-01-03 DIAGNOSIS — F418 Other specified anxiety disorders: Secondary | ICD-10-CM

## 2019-01-07 ENCOUNTER — Other Ambulatory Visit: Payer: Self-pay | Admitting: Family Medicine

## 2019-01-07 DIAGNOSIS — M797 Fibromyalgia: Secondary | ICD-10-CM

## 2019-01-07 DIAGNOSIS — F418 Other specified anxiety disorders: Secondary | ICD-10-CM

## 2019-01-12 ENCOUNTER — Other Ambulatory Visit: Payer: Self-pay | Admitting: Family Medicine

## 2019-01-12 DIAGNOSIS — F418 Other specified anxiety disorders: Secondary | ICD-10-CM

## 2019-01-12 DIAGNOSIS — M797 Fibromyalgia: Secondary | ICD-10-CM

## 2019-01-12 NOTE — Telephone Encounter (Signed)
Called the patient at [phone] left detailed message to call back to schedule appt virtual visit as not been seen since  11/2017

## 2019-01-13 ENCOUNTER — Other Ambulatory Visit: Payer: Self-pay

## 2019-01-13 ENCOUNTER — Encounter: Payer: Self-pay | Admitting: Family Medicine

## 2019-01-13 ENCOUNTER — Ambulatory Visit (INDEPENDENT_AMBULATORY_CARE_PROVIDER_SITE_OTHER): Payer: BLUE CROSS/BLUE SHIELD | Admitting: Family Medicine

## 2019-01-13 DIAGNOSIS — F418 Other specified anxiety disorders: Secondary | ICD-10-CM | POA: Diagnosis not present

## 2019-01-13 DIAGNOSIS — M797 Fibromyalgia: Secondary | ICD-10-CM

## 2019-01-13 MED ORDER — BUSPIRONE HCL 15 MG PO TABS: 15.0000 mg | ORAL_TABLET | Freq: Two times a day (BID) | ORAL | 6 refills | Status: DC

## 2019-01-13 MED ORDER — DULOXETINE HCL 30 MG PO CPEP: 30.0000 mg | ORAL_CAPSULE | Freq: Every day | ORAL | 6 refills | Status: DC

## 2019-01-13 MED ORDER — TRAZODONE HCL 50 MG PO TABS: ORAL_TABLET | ORAL | 6 refills | Status: DC

## 2019-01-13 NOTE — Progress Notes (Signed)
Chief Complaint  Patient presents with  . Medication Refill    Subjective: Patient is a 43 y.o. female here for med ck.  Doing well on BuSpar 15 mg qd, Cymbalta 30 mg/d, and Trazodone 25-50 mg qhs prn. No AE's. Work and current pandemic are stressful.   FM is well controlled on Cymbalta. Diet affects how she feels, but she has a good grasp of which types of foods.  ROS: Psych: No SI or HI  Past Medical History:  Diagnosis Date  . Anxiety with depression 07/01/2017  . Depression   . Fibromyalgia   . Vaginal Pap smear, abnormal     Objective: No conversational dyspnea Age appropriate judgment and insight Nml affect and mood  Assessment and Plan: Anxiety with depression - Plan: DULoxetine (CYMBALTA) 30 MG capsule, traZODone (DESYREL) 50 MG tablet, busPIRone (BUSPAR) 15 MG tablet  Fibromyalgia - Plan: DULoxetine (CYMBALTA) 30 MG capsule  Orders as above. Cont as above. Will call in 15 mg tab.  F/u in 6 mo for CPE. The patient voiced understanding and agreement to the plan.  Kerri Roche Shafter, DO 01/13/19  1:39 PM

## 2019-01-13 NOTE — Telephone Encounter (Signed)
Called left message to call back 

## 2019-01-15 DIAGNOSIS — F4322 Adjustment disorder with anxiety: Secondary | ICD-10-CM | POA: Diagnosis not present

## 2019-01-29 DIAGNOSIS — F4322 Adjustment disorder with anxiety: Secondary | ICD-10-CM | POA: Diagnosis not present

## 2019-01-30 ENCOUNTER — Other Ambulatory Visit: Payer: Self-pay | Admitting: Family Medicine

## 2019-01-30 DIAGNOSIS — F418 Other specified anxiety disorders: Secondary | ICD-10-CM

## 2019-02-19 DIAGNOSIS — F4322 Adjustment disorder with anxiety: Secondary | ICD-10-CM | POA: Diagnosis not present

## 2019-03-11 DIAGNOSIS — F4322 Adjustment disorder with anxiety: Secondary | ICD-10-CM | POA: Diagnosis not present

## 2019-04-02 DIAGNOSIS — F4322 Adjustment disorder with anxiety: Secondary | ICD-10-CM | POA: Diagnosis not present

## 2019-04-03 ENCOUNTER — Other Ambulatory Visit: Payer: Self-pay | Admitting: Family Medicine

## 2019-04-03 DIAGNOSIS — F418 Other specified anxiety disorders: Secondary | ICD-10-CM

## 2019-05-07 DIAGNOSIS — F4322 Adjustment disorder with anxiety: Secondary | ICD-10-CM | POA: Diagnosis not present

## 2019-05-13 ENCOUNTER — Ambulatory Visit (INDEPENDENT_AMBULATORY_CARE_PROVIDER_SITE_OTHER): Payer: BC Managed Care – PPO

## 2019-05-13 ENCOUNTER — Other Ambulatory Visit: Payer: Self-pay

## 2019-05-13 VITALS — BP 113/70 | HR 66 | Ht 67.0 in | Wt 242.0 lb

## 2019-05-13 DIAGNOSIS — N898 Other specified noninflammatory disorders of vagina: Secondary | ICD-10-CM | POA: Diagnosis not present

## 2019-05-13 NOTE — Progress Notes (Signed)
Patient seen and assessed by nursing staff during this encounter. I have reviewed the chart and agree with the documentation and plan.  Julie Wenzel, PA-C 05/13/2019 9:43 AM    

## 2019-05-13 NOTE — Progress Notes (Signed)
Pt presents to the office for vaginal discharge. Pt states that she has been experiencing vaginal discharge x 1 day. Self swab was sent to the lab. chiquita l wilson, CMA

## 2019-05-15 LAB — CERVICOVAGINAL ANCILLARY ONLY
Bacterial vaginitis: NEGATIVE
Candida vaginitis: NEGATIVE

## 2019-05-23 ENCOUNTER — Encounter: Payer: Self-pay | Admitting: Family Medicine

## 2019-05-28 ENCOUNTER — Telehealth: Payer: Self-pay

## 2019-05-28 NOTE — Telephone Encounter (Signed)
Pt called the office requesting wet prep results.Pt made aware that wet prep is negative.  Amylee Lodato l Ifeoluwa Beller, CMA

## 2019-06-04 DIAGNOSIS — F4322 Adjustment disorder with anxiety: Secondary | ICD-10-CM | POA: Diagnosis not present

## 2019-06-07 ENCOUNTER — Encounter: Payer: Self-pay | Admitting: Family Medicine

## 2019-06-18 ENCOUNTER — Other Ambulatory Visit: Payer: Self-pay

## 2019-06-18 ENCOUNTER — Encounter: Payer: Self-pay | Admitting: Family Medicine

## 2019-06-18 ENCOUNTER — Ambulatory Visit (INDEPENDENT_AMBULATORY_CARE_PROVIDER_SITE_OTHER): Payer: BC Managed Care – PPO | Admitting: Family Medicine

## 2019-06-18 DIAGNOSIS — Z9109 Other allergy status, other than to drugs and biological substances: Secondary | ICD-10-CM

## 2019-06-18 DIAGNOSIS — M797 Fibromyalgia: Secondary | ICD-10-CM | POA: Diagnosis not present

## 2019-06-18 DIAGNOSIS — F418 Other specified anxiety disorders: Secondary | ICD-10-CM | POA: Diagnosis not present

## 2019-06-18 MED ORDER — DULOXETINE HCL 60 MG PO CPEP: 60.0000 mg | ORAL_CAPSULE | Freq: Every day | ORAL | 3 refills | Status: DC

## 2019-06-18 NOTE — Progress Notes (Signed)
Chief Complaint  Patient presents with  . Medication Problem    Subjective Kerri Guerra presents for f/u anxiety/depression. Due to COVID-19 pandemic, we are interacting via web portal for an electronic face-to-face visit. I verified patient's ID using 2 identifiers. Patient agreed to proceed with visit via this method. Patient is at home, I am at office. Patient and I are present for visit.   She is currently being treated with Cymbalta 30 mg/d and Busapr 15 mg/d.  Reports good effect since treatment. No thoughts of harming self or others. No self-medication with alcohol, prescription drugs or illicit drugs.  She has a history of fibromyalgia.  She had been well controlled with Cymbalta and identifying her triggers.  She tries exercise around 30 minutes daily with walking.  She is not doing anything for the upper body routinely.  She has been having more upper pain in  shoulder/neck/headaches.  She attributes this to her fibromyalgia.  Massage will help for short period of time.  She has been using menthol type topicals without significant relief.  No other recent trigger.  +hx of allergies. Currently on Singulair and Allegra. Doing well overall. Still having some itching in her ears. Following with allergy team.   ROS Psych: No homicidal or suicidal thoughts MSK: +neck/shoulder pain  Past Medical History:  Diagnosis Date  . Anxiety with depression 07/01/2017  . Fibromyalgia   . Vaginal Pap smear, abnormal    Exam No conversational dyspnea Age appropriate judgment and insight Nml affect and mood  Assessment and Plan  Fibromyalgia - Plan: DULoxetine (CYMBALTA) 60 MG capsule  Anxiety with depression  Environmental allergies  1-increase dosage of Cymbalta from 30 mg daily to 60 mg daily.  Encouraged physical activity for the upper body such as yoga or lifting weights.  Recommended she reach out to her GYN team regarding her medications and concerns for pregnancy.  If there are  no concerns with her current med list, I doubt there would be any contraindication for the medication increase. 2-continue current medici 3- continue Singulair and Allegra, will consider changing to Zyrtec if she is interested in getting pregnant. F/u in 1 mo to reck #1, otherwise 6 mo for CPE. The patient voiced understanding and agreement to the plan.  [place], DO 06/18/19 11:56 AM

## 2019-07-09 ENCOUNTER — Encounter: Payer: Self-pay | Admitting: Family Medicine

## 2019-07-23 DIAGNOSIS — F4322 Adjustment disorder with anxiety: Secondary | ICD-10-CM | POA: Diagnosis not present

## 2019-07-29 DIAGNOSIS — F4322 Adjustment disorder with anxiety: Secondary | ICD-10-CM | POA: Diagnosis not present

## 2019-07-31 DIAGNOSIS — H6500 Acute serous otitis media, unspecified ear: Secondary | ICD-10-CM | POA: Diagnosis not present

## 2019-07-31 DIAGNOSIS — Z20828 Contact with and (suspected) exposure to other viral communicable diseases: Secondary | ICD-10-CM | POA: Diagnosis not present

## 2019-07-31 DIAGNOSIS — J0110 Acute frontal sinusitis, unspecified: Secondary | ICD-10-CM | POA: Diagnosis not present

## 2019-07-31 DIAGNOSIS — J309 Allergic rhinitis, unspecified: Secondary | ICD-10-CM | POA: Diagnosis not present

## 2019-08-12 DIAGNOSIS — F4322 Adjustment disorder with anxiety: Secondary | ICD-10-CM | POA: Diagnosis not present

## 2019-08-13 ENCOUNTER — Ambulatory Visit (INDEPENDENT_AMBULATORY_CARE_PROVIDER_SITE_OTHER): Payer: BC Managed Care – PPO | Admitting: Family Medicine

## 2019-08-13 ENCOUNTER — Other Ambulatory Visit: Payer: Self-pay

## 2019-08-13 ENCOUNTER — Encounter: Payer: Self-pay | Admitting: Family Medicine

## 2019-08-13 VITALS — BP 118/77 | HR 82 | Ht 67.0 in | Wt 248.0 lb

## 2019-08-13 DIAGNOSIS — Z01419 Encounter for gynecological examination (general) (routine) without abnormal findings: Secondary | ICD-10-CM | POA: Diagnosis not present

## 2019-08-13 DIAGNOSIS — Z1151 Encounter for screening for human papillomavirus (HPV): Secondary | ICD-10-CM

## 2019-08-13 DIAGNOSIS — N979 Female infertility, unspecified: Secondary | ICD-10-CM

## 2019-08-13 DIAGNOSIS — Z124 Encounter for screening for malignant neoplasm of cervix: Secondary | ICD-10-CM | POA: Diagnosis not present

## 2019-08-13 DIAGNOSIS — R87612 Low grade squamous intraepithelial lesion on cytologic smear of cervix (LGSIL): Secondary | ICD-10-CM | POA: Diagnosis not present

## 2019-08-13 NOTE — Progress Notes (Signed)
GYNECOLOGY ANNUAL PREVENTATIVE CARE ENCOUNTER NOTE  Subjective:   Kerri Guerra is a 43 y.o. G23P0010 female here for a routine annual gynecologic exam.  Current complaints: infertility despite almost a year trying.   Denies abnormal vaginal bleeding, discharge, pelvic pain, problems with intercourse or other gynecologic concerns.    Gynecologic History Patient's last menstrual period was 07/19/2019 (exact date). Patient is sexually active  Contraception: none Last Pap: 2018. Results were: abnormal - ASCUS +HPV Last mammogram: n/a.   Obstetric History OB History  Gravida Para Term Preterm AB Living  1       1    SAB TAB Ectopic Multiple Live Births  1            # Outcome Date GA Lbr Len/2nd Weight Sex Delivery Anes PTL Lv  1 SAB 04/08/17 [redacted]w[redacted]d           Past Medical History:  Diagnosis Date  . Anxiety with depression 07/01/2017  . Fibromyalgia   . Vaginal Pap smear, abnormal     Past Surgical History:  Procedure Laterality Date  . COLPOSCOPY      Current Outpatient Medications on File Prior to Visit  Medication Sig Dispense Refill  . busPIRone (BUSPAR) 15 MG tablet Take 1 tablet (15 mg total) by mouth 2 (two) times daily. 60 tablet 6  . DULoxetine (CYMBALTA) 60 MG capsule Take 1 capsule (60 mg total) by mouth daily. 30 capsule 3  . fexofenadine (ALLEGRA) 180 MG tablet Take 1 tablet (180 mg total) by mouth daily. 30 tablet 5  . montelukast (SINGULAIR) 10 MG tablet Take 1 tablet by mouth once daily 30 tablet 11  . traZODone (DESYREL) 50 MG tablet TAKE 1/2 TO 1 (ONE-HALF TO ONE) TABLET BY MOUTH AT BEDTIME AS NEEDED FOR SLEEP 30 tablet 6  . ibuprofen (ADVIL,MOTRIN) 400 MG tablet Take 400 mg by mouth every 6 (six) hours as needed.     No current facility-administered medications on file prior to visit.     No Known Allergies  Social History   Socioeconomic History  . Marital status: Married    Spouse name: Not on file  . Number of children: Not on file  . Years  of education: Not on file  . Highest education level: Not on file  Occupational History  . Not on file  Social Needs  . Financial resource strain: Not on file  . Food insecurity    Worry: Not on file    Inability: Not on file  . Transportation needs    Medical: Not on file    Non-medical: Not on file  Tobacco Use  . Smoking status: Former Smoker    Packs/day: 0.50    Types: Cigarettes  . Smokeless tobacco: Never Used  Substance and Sexual Activity  . Alcohol use: Yes    Alcohol/week: 3.0 standard drinks    Types: 3 Standard drinks or equivalent per week  . Drug use: No  . Sexual activity: Yes  Lifestyle  . Physical activity    Days per week: Not on file    Minutes per session: Not on file  . Stress: Not on file  Relationships  . Social Musician on phone: Not on file    Gets together: Not on file    Attends religious service: Not on file    Active member of club or organization: Not on file    Attends meetings of clubs or organizations: Not on file  Relationship status: Not on file  . Intimate partner violence    Fear of current or ex partner: Not on file    Emotionally abused: Not on file    Physically abused: Not on file    Forced sexual activity: Not on file  Other Topics Concern  . Not on file  Social History Narrative  . Not on file    Family History  Problem Relation Age of Onset  . Cancer Maternal Grandmother   . Cancer Father        PROSTATE  . Diabetes Mother   . Hypertension Mother   . Stroke Neg Hx     The following portions of the patient's history were reviewed and updated as appropriate: allergies, current medications, past family history, past medical history, past social history, past surgical history and problem list.  Review of Systems Pertinent items are noted in HPI.   Objective:  BP 118/77   Pulse 82   Ht 5\' 7"  (1.702 m)   Wt 248 lb (112.5 kg)   LMP 07/19/2019 (Exact Date)   BMI 38.84 kg/m  Wt Readings from Last 3  Encounters:  08/13/19 248 lb (112.5 kg)  05/13/19 242 lb (109.8 kg)  12/10/18 251 lb 3.2 oz (113.9 kg)     CONSTITUTIONAL: Well-developed, well-nourished female in no acute distress.  HENT:  Normocephalic, atraumatic, External right and left ear normal. Oropharynx is clear and moist EYES: Conjunctivae and EOM are normal. Pupils are equal, round, and reactive to light. No scleral icterus.  NECK: Normal range of motion, supple, no masses.  Normal thyroid.   CARDIOVASCULAR: Normal heart rate noted, regular rhythm RESPIRATORY: Clear to auscultation bilaterally. Effort and breath sounds normal, no problems with respiration noted. BREASTS: Symmetric in size. No masses, skin changes, nipple drainage, or lymphadenopathy. ABDOMEN: Soft, normal bowel sounds, no distention noted.  No tenderness, rebound or guarding.  PELVIC: Normal appearing external genitalia; normal appearing vaginal mucosa and cervix.  No abnormal discharge noted.  Normal uterine size, no other palpable masses, no uterine or adnexal tenderness. MUSCULOSKELETAL: Normal range of motion. No tenderness.  No cyanosis, clubbing, or edema.  2+ distal pulses. SKIN: Skin is warm and dry. No rash noted. Not diaphoretic. No erythema. No pallor. NEUROLOGIC: Alert and oriented to person, place, and time. Normal reflexes, muscle tone coordination. No cranial nerve deficit noted. PSYCHIATRIC: Normal mood and affect. Normal behavior. Normal judgment and thought content.  Assessment:  Annual gynecologic examination with pap smear   Plan:  1. Well Woman Exam Will follow up results of pap smear and manage accordingly. Mammogram scheduled - MM Digital Screening; Future - Cytology - PAP( [place])  2. Infertility, female Referral to REI made - Ambulatory referral to Endocrinology   Routine preventative health maintenance measures emphasized. Please refer to After Visit Summary for other counseling recommendations.    Loma Boston, [place] for [employer]

## 2019-08-13 NOTE — Progress Notes (Signed)
Patient presents for annual exam. Patient states she has some questions about fertility for the doctor. Kathrene Alu RN

## 2019-08-20 DIAGNOSIS — F4322 Adjustment disorder with anxiety: Secondary | ICD-10-CM | POA: Diagnosis not present

## 2019-08-24 ENCOUNTER — Encounter (HOSPITAL_BASED_OUTPATIENT_CLINIC_OR_DEPARTMENT_OTHER): Payer: Self-pay

## 2019-08-24 ENCOUNTER — Ambulatory Visit (HOSPITAL_BASED_OUTPATIENT_CLINIC_OR_DEPARTMENT_OTHER)
Admission: RE | Admit: 2019-08-24 | Discharge: 2019-08-24 | Disposition: A | Discharge status: Home / Self Care | Payer: BC Managed Care – PPO | Source: Ambulatory Visit | Attending: Family Medicine | Admitting: Family Medicine

## 2019-08-24 ENCOUNTER — Other Ambulatory Visit: Payer: Self-pay

## 2019-08-24 DIAGNOSIS — Z1231 Encounter for screening mammogram for malignant neoplasm of breast: Secondary | ICD-10-CM | POA: Insufficient documentation

## 2019-08-24 DIAGNOSIS — Z01419 Encounter for gynecological examination (general) (routine) without abnormal findings: Secondary | ICD-10-CM

## 2019-08-24 LAB — CYTOLOGY - PAP
Comment: NEGATIVE
High risk HPV: POSITIVE — AB

## 2019-09-02 DIAGNOSIS — F4322 Adjustment disorder with anxiety: Secondary | ICD-10-CM | POA: Diagnosis not present

## 2019-09-07 DIAGNOSIS — E288 Other ovarian dysfunction: Secondary | ICD-10-CM | POA: Diagnosis not present

## 2019-09-07 DIAGNOSIS — Z319 Encounter for procreative management, unspecified: Secondary | ICD-10-CM | POA: Diagnosis not present

## 2019-09-07 DIAGNOSIS — N979 Female infertility, unspecified: Secondary | ICD-10-CM | POA: Diagnosis not present

## 2019-09-07 DIAGNOSIS — Z31430 Encounter of female for testing for genetic disease carrier status for procreative management: Secondary | ICD-10-CM | POA: Diagnosis not present

## 2019-09-16 DIAGNOSIS — F4322 Adjustment disorder with anxiety: Secondary | ICD-10-CM | POA: Diagnosis not present

## 2019-09-17 DIAGNOSIS — F4322 Adjustment disorder with anxiety: Secondary | ICD-10-CM | POA: Diagnosis not present

## 2019-09-19 ENCOUNTER — Other Ambulatory Visit: Payer: Self-pay | Admitting: Family Medicine

## 2019-09-28 DIAGNOSIS — Z3202 Encounter for pregnancy test, result negative: Secondary | ICD-10-CM | POA: Diagnosis not present

## 2019-09-28 DIAGNOSIS — Z3141 Encounter for fertility testing: Secondary | ICD-10-CM | POA: Diagnosis not present

## 2019-09-30 ENCOUNTER — Encounter: Payer: Self-pay | Admitting: Family Medicine

## 2019-09-30 ENCOUNTER — Other Ambulatory Visit (HOSPITAL_COMMUNITY)
Admission: RE | Admit: 2019-09-30 | Discharge: 2019-09-30 | Disposition: A | Discharge status: Home / Self Care | Payer: BC Managed Care – PPO | Source: Ambulatory Visit | Attending: Family Medicine | Admitting: Family Medicine

## 2019-09-30 ENCOUNTER — Other Ambulatory Visit: Payer: Self-pay

## 2019-09-30 ENCOUNTER — Ambulatory Visit (INDEPENDENT_AMBULATORY_CARE_PROVIDER_SITE_OTHER): Payer: BC Managed Care – PPO | Admitting: Family Medicine

## 2019-09-30 VITALS — BP 123/76 | HR 87 | Ht 67.0 in | Wt 252.0 lb

## 2019-09-30 DIAGNOSIS — R87612 Low grade squamous intraepithelial lesion on cytologic smear of cervix (LGSIL): Secondary | ICD-10-CM | POA: Insufficient documentation

## 2019-09-30 DIAGNOSIS — Z3202 Encounter for pregnancy test, result negative: Secondary | ICD-10-CM | POA: Diagnosis not present

## 2019-09-30 DIAGNOSIS — F4322 Adjustment disorder with anxiety: Secondary | ICD-10-CM | POA: Diagnosis not present

## 2019-09-30 DIAGNOSIS — Z01812 Encounter for preprocedural laboratory examination: Secondary | ICD-10-CM

## 2019-09-30 DIAGNOSIS — N870 Mild cervical dysplasia: Secondary | ICD-10-CM

## 2019-09-30 LAB — POCT URINE PREGNANCY: Preg Test, Ur: NEGATIVE

## 2019-09-30 NOTE — Progress Notes (Signed)
Patient Name: Kerri Guerra, female   DOB: [birth year], 43 y.o.  MRN: [id number]  Colposcopy Procedure Note:  G1P0010 Pregnancy status: Unknown Indications: LSIL HPV:  Positive Cervical History:  Previous Abnormal Pap: ASCUS 2018  Previous Colposcopy: none  Previous LEEP or Cryo: none  Smoking: Former Smoker Hysterectomy: No   Patient given informed consent, signed copy in the chart, time out was performed.    Exam: Vulva and Vagina grossly normal.  Cervix viewed with speculum and colposcope after application of acetic acid:  Cervix Fully Visualized Squamocolumnar Junction Visibility: Fully visualized  Acetowhite lesions: 12 o'clock  Other Lesions: None Punctation: Not present  Mosaicism: Not present Abnormal vasculature: No   Biopsies: 12 o'clock ECC: yes  Hemostasis achieved with:  Silver Nitrate  Colposcopy Impression:  CIN1   Patient was given post procedure instructions.  Will call patient with results.

## 2019-10-01 ENCOUNTER — Ambulatory Visit: Payer: BC Managed Care – PPO | Admitting: Allergy & Immunology

## 2019-10-01 ENCOUNTER — Encounter: Payer: Self-pay | Admitting: Allergy & Immunology

## 2019-10-01 VITALS — BP 128/88 | HR 95 | Temp 98.1°F | Resp 18 | Ht 69.0 in | Wt 253.6 lb

## 2019-10-01 DIAGNOSIS — J3089 Other allergic rhinitis: Secondary | ICD-10-CM

## 2019-10-01 DIAGNOSIS — L299 Pruritus, unspecified: Secondary | ICD-10-CM

## 2019-10-01 DIAGNOSIS — J302 Other seasonal allergic rhinitis: Secondary | ICD-10-CM | POA: Diagnosis not present

## 2019-10-01 MED ORDER — TRIAMCINOLONE ACETONIDE 0.1 % EX OINT: TOPICAL_OINTMENT | CUTANEOUS | 2 refills | Status: DC

## 2019-10-01 MED ORDER — MONTELUKAST SODIUM 10 MG PO TABS: 10.0000 mg | ORAL_TABLET | Freq: Every day | ORAL | 5 refills | Status: DC

## 2019-10-01 NOTE — Progress Notes (Signed)
FOLLOW UP  Date of Service/Encounter:  10/01/19   Assessment:   Seasonal and perennial allergic rhinitis (trees, weeds, grasses, indoor molds, outdoor molds, dust mites, cat, dog and cockroach)  Plan/Recommendations:   1. Season and perennial allergic rhinitis (trees, weeds, grasses, indoor molds, outdoor molds, dust mites, cat, dog and cockroach) - Continue with montelukast 10 mg once a day. - Continue with Allegra daily (you can use it twice daily on particularly bad days). - Add on triamcinolone 0.1% ointment applied via Q-tip twice daily to the ear canals as needed for itching.  - Consider adding on olive oil 2-3 drops a few times weekly to keep the canals clear of ear wax.  - I do not think that allergy shots are needed at this time.   2. Return in about 1 year (around 09/30/2020). This can be an in-person, a virtual Webex or a telephone follow up visit.   Subjective:   Kerri Guerra is a 43 y.o. female presenting today for follow up of  Chief Complaint  Patient presents with  . Allergies    Has itchy ears sometimes. Doing well otherwise.     Kerri Guerra has a history of the following: Patient Active Problem List   Diagnosis Date Noted  . Bacterial vaginosis 10/22/2018  . 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 Dyspnea on exertion 10/22/2018  . Echocardiogram abnormal 10/22/2018  . Near syncope 10/22/2018  . Palpitations 10/22/2018  . ASCUS with positive high risk HPV cervical 07/25/2018  . Environmental allergies 11/15/2017  . Seasonal and perennial allergic rhinitis 08/19/2017  . Fibromyalgia 07/01/2017  . Anxiety with depression 07/01/2017    History obtained from: chart review and patient.  Kerri Guerra is a 43 y.o. female presenting for a follow up visit.  She has a history of seasonal and perennial allergic rhinitis.  She was last seen in December 2019 by her nurse practitioner.  At that time, she was continued on montelukast and  adherence to nasal sprays was recommended (Flonase and Astelin).  Allergy shots were discussed.  She was experiencing sedation with Claritin but was doing better with Allegra.  She was also given samples of Xyzal.  Since last visit, she has done well. 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   Symptoms are going fairly well. She does report some itching in her ears. She has changed to hot water with her sheeting washing. She does have hydrocortisone that she puts in with a Q tip. The Allegra takes care of the throat itching. She is not using her nose sprays much at all.   She did have a sinus infection a couple of months ago. She typically gets them 1-2 times per year.  Otherwise, there have been no changes to her past medical history, surgical history, family history, or social history.    Review of Systems  Constitutional: Negative.  Negative for chills, fever, malaise/fatigue and weight loss.  HENT: Negative.  Negative for congestion, ear discharge, ear pain and sore throat.   Eyes: Negative for pain, discharge and redness.  Respiratory: Negative for cough, sputum production, shortness of breath and wheezing.   Cardiovascular: Negative.  Negative for chest pain and palpitations.  Gastrointestinal: Negative for abdominal pain, constipation, diarrhea, heartburn, nausea and vomiting.  Skin: Negative.  Negative for itching and rash.  Neurological: Negative  for dizziness and headaches.  Endo/Heme/Allergies: Negative for environmental allergies. Does not bruise/bleed easily.       Objective:   Blood pressure 128/88, pulse 95, temperature 98.1 F (36.7 C), temperature source Temporal, resp. rate 18, height 5\' 9"  (1.753 m), weight 253 lb 9.6 oz (115 kg), last menstrual period 09/14/2019, SpO2 98 %. Body mass index is 37.45 kg/m.   Physical  Exam:  Physical Exam  Constitutional: She appears well-developed.  Pleasant cooperative female.  HENT:  Head: Normocephalic and atraumatic.  Right Ear: Tympanic membrane, external ear and ear canal normal.  Left Ear: Tympanic membrane, external ear and ear canal normal.  Nose: Mucosal edema and rhinorrhea present. No nasal deformity or septal deviation. No epistaxis. Right sinus exhibits no maxillary sinus tenderness and no frontal sinus tenderness. Left sinus exhibits no maxillary sinus tenderness and no frontal sinus tenderness.  Mouth/Throat: Uvula is midline and oropharynx is clear and moist. Mucous membranes are not pale and not dry.  There is cobblestoning in the posterior oropharynx.  Tympanic membranes are both intact.  There is no cerumen appreciated.  Eyes: Pupils are equal, round, and reactive to light. Conjunctivae and EOM are normal. Right eye exhibits no chemosis and no discharge. Left eye exhibits no chemosis and no discharge. Right conjunctiva is not injected. Left conjunctiva is not injected.  Cardiovascular: Normal rate, regular rhythm and normal heart sounds.  Respiratory: Effort normal and breath sounds normal. No accessory muscle usage. No tachypnea. No respiratory distress. She has no wheezes. She has no rhonchi. She has no rales. She exhibits no tenderness.  Moving air well in all lung fields.  No increased work of breathing.  Lymphadenopathy:    She has no cervical adenopathy.  Neurological: She is alert.  Skin: No abrasion, no petechiae and no rash noted. Rash is not papular, not vesicular and not urticarial. No erythema. No pallor.  Psychiatric: She has a normal mood and affect.     Diagnostic studies: none      Salvatore Marvel, MD  [facility]

## 2019-10-01 NOTE — Patient Instructions (Addendum)
1. Season and perennial allergic rhinitis (trees, weeds, grasses, indoor molds, outdoor molds, dust mites, cat, dog and cockroach) - Continue with montelukast 10 mg once a day. - Continue with Allegra daily (you can use it twice daily on particularly bad days). - Add on triamcinolone 0.1% ointment applied via Q-tip twice daily to the ear canals as needed for itching.  - Consider adding on olive oil 2-3 drops a few times weekly to keep the canals clear of ear wax.  - I do not think that allergy shots are needed at this time.   2. Return in about 1 year (around 09/30/2020). This can be an in-person, a virtual Webex or a telephone follow up visit.   Please inform us of any Emergency Department visits, hospitalizations, or changes in symptoms. Call us before going to the ED for breathing or allergy symptoms since we might be able to fit you in for a sick visit. Feel free to contact us anytime with any questions, problems, or concerns.  It was a pleasure to see you again today! Good luck with the job hunt!   Websites that have reliable patient information: 1. American Academy of Asthma, Allergy, and Immunology: www.aaaai.org 2. Food Allergy Research and Education (FARE): foodallergy.org 3. Mothers of Asthmatics: http://www.asthmacommunitynetwork.org 4. American College of Allergy, Asthma, and Immunology: www.acaai.org  "Like" [place] on Facebook and Instagram for our latest updates!        Make sure you are registered to vote! If you have moved or changed any of your contact information, you will need to get this updated before voting!  In some cases, you MAY be able to register to vote online: [email or web address]

## 2019-10-05 LAB — SURGICAL PATHOLOGY

## 2019-10-12 ENCOUNTER — Other Ambulatory Visit: Payer: Self-pay | Admitting: *Deleted

## 2019-10-12 MED ORDER — MONTELUKAST SODIUM 10 MG PO TABS: 10.0000 mg | ORAL_TABLET | Freq: Every day | ORAL | 5 refills | Status: DC

## 2019-10-12 MED ORDER — TRIAMCINOLONE ACETONIDE 0.1 % EX OINT: TOPICAL_OINTMENT | CUTANEOUS | 2 refills | Status: DC

## 2019-10-13 ENCOUNTER — Other Ambulatory Visit: Payer: Self-pay | Admitting: Family Medicine

## 2019-10-13 DIAGNOSIS — M797 Fibromyalgia: Secondary | ICD-10-CM

## 2019-10-13 DIAGNOSIS — F418 Other specified anxiety disorders: Secondary | ICD-10-CM

## 2019-10-13 MED ORDER — DULOXETINE HCL 60 MG PO CPEP: 60.0000 mg | ORAL_CAPSULE | Freq: Every day | ORAL | 1 refills | Status: DC

## 2019-10-13 MED ORDER — TRAZODONE HCL 50 MG PO TABS: ORAL_TABLET | ORAL | 6 refills | Status: DC

## 2019-10-13 NOTE — Telephone Encounter (Signed)
Kayla with [facility] called for Rx refills on   trazodone 50 mg  Duloxetine 60 mg

## 2019-10-14 DIAGNOSIS — F4322 Adjustment disorder with anxiety: Secondary | ICD-10-CM | POA: Diagnosis not present

## 2019-10-22 DIAGNOSIS — F4322 Adjustment disorder with anxiety: Secondary | ICD-10-CM | POA: Diagnosis not present

## 2019-10-28 DIAGNOSIS — F4322 Adjustment disorder with anxiety: Secondary | ICD-10-CM | POA: Diagnosis not present

## 2019-11-19 DIAGNOSIS — F4322 Adjustment disorder with anxiety: Secondary | ICD-10-CM | POA: Diagnosis not present

## 2019-12-23 ENCOUNTER — Other Ambulatory Visit: Payer: Self-pay | Admitting: Family Medicine

## 2019-12-23 DIAGNOSIS — F418 Other specified anxiety disorders: Secondary | ICD-10-CM

## 2020-01-30 ENCOUNTER — Other Ambulatory Visit: Payer: Self-pay | Admitting: Family Medicine

## 2020-01-30 DIAGNOSIS — F418 Other specified anxiety disorders: Secondary | ICD-10-CM

## 2020-03-20 ENCOUNTER — Other Ambulatory Visit: Payer: Self-pay | Admitting: Family Medicine

## 2020-03-20 DIAGNOSIS — F418 Other specified anxiety disorders: Secondary | ICD-10-CM

## 2020-03-30 ENCOUNTER — Other Ambulatory Visit: Payer: Self-pay | Admitting: Family Medicine

## 2020-03-30 DIAGNOSIS — M797 Fibromyalgia: Secondary | ICD-10-CM

## 2020-04-20 ENCOUNTER — Other Ambulatory Visit: Payer: Self-pay | Admitting: Family Medicine

## 2020-04-20 DIAGNOSIS — F418 Other specified anxiety disorders: Secondary | ICD-10-CM

## 2020-04-30 ENCOUNTER — Other Ambulatory Visit: Payer: Self-pay | Admitting: Allergy & Immunology

## 2020-04-30 ENCOUNTER — Other Ambulatory Visit: Payer: Self-pay | Admitting: Family Medicine

## 2020-04-30 DIAGNOSIS — F418 Other specified anxiety disorders: Secondary | ICD-10-CM

## 2020-05-02 NOTE — Telephone Encounter (Signed)
Last OV---06/18/2019 Last RF--10/13/2019 ----  #30 with 6 refills

## 2020-05-20 ENCOUNTER — Encounter: Payer: Self-pay | Admitting: Family Medicine

## 2020-05-20 ENCOUNTER — Other Ambulatory Visit (HOSPITAL_COMMUNITY)
Admission: RE | Admit: 2020-05-20 | Discharge: 2020-05-20 | Disposition: A | Discharge status: Home / Self Care | Payer: BC Managed Care – PPO | Source: Ambulatory Visit | Attending: Family Medicine | Admitting: Family Medicine

## 2020-05-20 ENCOUNTER — Ambulatory Visit (INDEPENDENT_AMBULATORY_CARE_PROVIDER_SITE_OTHER): Payer: BC Managed Care – PPO | Admitting: Family Medicine

## 2020-05-20 ENCOUNTER — Other Ambulatory Visit: Payer: Self-pay

## 2020-05-20 VITALS — BP 108/74 | HR 72 | Ht 67.0 in | Wt 249.0 lb

## 2020-05-20 DIAGNOSIS — R399 Unspecified symptoms and signs involving the genitourinary system: Secondary | ICD-10-CM | POA: Insufficient documentation

## 2020-05-20 DIAGNOSIS — N898 Other specified noninflammatory disorders of vagina: Secondary | ICD-10-CM

## 2020-05-20 LAB — POCT URINALYSIS DIPSTICK OB
Glucose, UA: NEGATIVE
Ketones, UA: NEGATIVE
Leukocytes, UA: NEGATIVE
Nitrite, UA: NEGATIVE
Spec Grav, UA: 1.015 (ref 1.010–1.025)
pH, UA: 5 (ref 5.0–8.0)

## 2020-05-20 MED ORDER — CEFADROXIL 500 MG PO CAPS
500.0000 mg | ORAL_CAPSULE | Freq: Two times a day (BID) | ORAL | 0 refills | Status: AC
Start: 2020-05-20 — End: 2020-05-23

## 2020-05-20 MED ORDER — CLOTRIMAZOLE 1 % EX CREA: 1.0000 "application " | TOPICAL_CREAM | Freq: Two times a day (BID) | CUTANEOUS | 0 refills | Status: DC

## 2020-05-20 MED ORDER — FLUCONAZOLE 150 MG PO TABS
150.0000 mg | ORAL_TABLET | Freq: Once | ORAL | 3 refills | Status: AC
Start: 2020-05-20 — End: 2020-05-20

## 2020-05-20 MED FILL — FLUCONAZOLE 150 MG TABS: 150 | 1 days supply | Qty: 1 | Fill #0

## 2020-05-20 MED FILL — ANTIFUNGAL CLOTRIMAZOLE 1 %: 1 | 14 days supply | Qty: 28 | Fill #0

## 2020-05-20 MED FILL — CEFADROXIL 500 MG CAPSULE: 500 | 3 days supply | Qty: 6 | Fill #0

## 2020-05-20 NOTE — Progress Notes (Signed)
   Subjective:    Patient ID: Kerri Guerra, female    DOB: 01-May-1976, 44 y.o.   MRN: [id number]  HPI  Seen for urinary frequency for the past several days. Also having labial irritation/itching for the past 3 weeks. No abnormal discharge, but does get BV occasionally.  Review of Systems     Objective:   Physical Exam Vitals reviewed. Exam conducted with a chaperone present.  Genitourinary:    Labia:        Right: No rash, tenderness or lesion.        Left: No rash, tenderness or lesion.     Psychiatric:        Mood and Affect: Mood normal.        Thought Content: Thought content normal.        Judgment: Judgment normal.         Assessment & Plan:  1. Vaginal discharge Irritation to clitoral area. Will give cl - Cervicovaginal ancillary only( [place])  2. UTI symptoms Cefadroxil BID x3 days. Diflucan for yeast prevention - POC Urinalysis Dipstick OB - Urine Culture

## 2020-05-20 NOTE — Progress Notes (Signed)
Pt states she has been having vaginal itching and frequent urination x 3 weeks . Ellena Kamen l Acasia Skilton, CMA

## 2020-05-22 LAB — URINE CULTURE

## 2020-05-24 LAB — CERVICOVAGINAL ANCILLARY ONLY
Bacterial Vaginitis (gardnerella): NEGATIVE
Candida Glabrata: NEGATIVE
Candida Vaginitis: NEGATIVE
Comment: NEGATIVE
Comment: NEGATIVE
Comment: NEGATIVE

## 2020-06-13 ENCOUNTER — Other Ambulatory Visit: Payer: Self-pay | Admitting: Family Medicine

## 2020-06-13 DIAGNOSIS — M797 Fibromyalgia: Secondary | ICD-10-CM

## 2020-06-13 DIAGNOSIS — F418 Other specified anxiety disorders: Secondary | ICD-10-CM

## 2020-06-16 ENCOUNTER — Other Ambulatory Visit: Payer: Self-pay

## 2020-06-16 ENCOUNTER — Other Ambulatory Visit: Payer: Self-pay | Admitting: Family Medicine

## 2020-06-16 DIAGNOSIS — F418 Other specified anxiety disorders: Secondary | ICD-10-CM

## 2020-06-16 DIAGNOSIS — M797 Fibromyalgia: Secondary | ICD-10-CM

## 2020-06-17 MED ORDER — MONTELUKAST SODIUM 10 MG PO TABS
10.0000 mg | ORAL_TABLET | Freq: Every day | ORAL | 3 refills | Status: DC
Start: 2020-06-17 — End: 2021-03-28

## 2020-06-27 ENCOUNTER — Ambulatory Visit: Payer: BC Managed Care – PPO | Admitting: Family Medicine

## 2020-07-06 ENCOUNTER — Telehealth (INDEPENDENT_AMBULATORY_CARE_PROVIDER_SITE_OTHER): Payer: BC Managed Care – PPO | Admitting: Family Medicine

## 2020-07-06 ENCOUNTER — Encounter: Payer: Self-pay | Admitting: Family Medicine

## 2020-07-06 ENCOUNTER — Other Ambulatory Visit: Payer: Self-pay

## 2020-07-06 DIAGNOSIS — F418 Other specified anxiety disorders: Secondary | ICD-10-CM

## 2020-07-06 DIAGNOSIS — M797 Fibromyalgia: Secondary | ICD-10-CM | POA: Diagnosis not present

## 2020-07-06 MED ORDER — BUSPIRONE HCL 15 MG PO TABS: 15.0000 mg | ORAL_TABLET | Freq: Two times a day (BID) | ORAL | 2 refills | Status: DC

## 2020-07-06 MED ORDER — DULOXETINE HCL 60 MG PO CPEP: 60.0000 mg | ORAL_CAPSULE | Freq: Every day | ORAL | 2 refills | Status: DC

## 2020-07-06 NOTE — Progress Notes (Signed)
Chief Complaint  Patient presents with  . Follow-up    medication    Subjective Kerri Guerra presents for f/u anxiety/depression. Due to COVID-19 pandemic, we are interacting via web portal for an electronic face-to-face visit. I verified patient's ID using 2 identifiers. Patient agreed to proceed with visit via this method. Patient is at home, I am at office. Patient and I are present for visit.   Pt is currently being treated with BuSpar 50 mg twice daily, Cymbalta, and trazodone 50 mg nightly as needed.  Reports doing well since treatment. No thoughts of harming self or others. No self-medication with alcohol, prescription drugs or illicit drugs.  Patient also has a history of fibromyalgia.  She is relatively well controlled on Cymbalta 60 mg daily.  She is tolerating medicine well without side effects.  She tries to stay active.  She is able to complete her daily activities.  Past Medical History:  Diagnosis Date  . Anxiety with depression 07/01/2017  . Fibromyalgia   . Vaginal Pap smear, abnormal    Allergies as of 07/06/2020   No Known Allergies     Medication List       Accurate as of July 06, 2020  4:41 PM. If you have any questions, ask your nurse or doctor.        busPIRone 15 MG tablet Commonly known as: BUSPAR Take 1 tablet (15 mg total) by mouth 2 (two) times daily. What changed: See the new instructions. Changed by: Sharlene Dory, DO   clotrimazole 1 % cream Commonly known as: LOTRIMIN Apply 1 application topically 2 (two) times daily.   DULoxetine 60 MG capsule Commonly known as: CYMBALTA Take 1 capsule (60 mg total) by mouth daily.   fexofenadine 180 MG tablet Commonly known as: ALLEGRA Take 1 tablet (180 mg total) by mouth daily.   ibuprofen 400 MG tablet Commonly known as: ADVIL Take 400 mg by mouth every 6 (six) hours as needed.   lactobacillus acidophilus Tabs tablet Take 2 tablets by mouth 3 (three) times daily.     montelukast 10 MG tablet Commonly known as: SINGULAIR Take 1 tablet (10 mg total) by mouth daily.   PRENATAL VITAMINS PO Take by mouth.   traZODone 50 MG tablet Commonly known as: DESYREL Take 1/2 to 1 tablet by mouth at bedtime as needed for sleep.   triamcinolone ointment 0.1 % Commonly known as: KENALOG Apply in the ear canal with a q-tip twice daily as directed.       Exam No conversational dyspnea Age appropriate judgment and insight Nml affect and mood  Assessment and Plan  Anxiety with depression - Plan: busPIRone (BUSPAR) 15 MG tablet  Fibromyalgia - Plan: DULoxetine (CYMBALTA) 60 MG capsule  1.  Chronic, stable.  Continue BuSpar 50 mg twice daily, could consider doing as needed if she is well controlled.  Continue Cymbalta 60 mg daily.  Continue trazodone 50 mg nightly as needed. 2.  Chronic, stable.  Counseled on exercise.  Continue Cymbalta 60 mg daily. Follow-up in 6 months for physical or as needed. The patient voiced understanding and agreement to the plan.  Jilda Roche [place], DO 07/06/20 4:41 PM

## 2020-07-06 NOTE — Addendum Note (Signed)
Addended by: Radene Gunning on: 07/06/2020 04:42 PM   Modules accepted: Level of Service

## 2020-08-04 ENCOUNTER — Ambulatory Visit: Payer: BC Managed Care – PPO

## 2020-08-10 ENCOUNTER — Ambulatory Visit (INDEPENDENT_AMBULATORY_CARE_PROVIDER_SITE_OTHER): Payer: BC Managed Care – PPO

## 2020-08-10 ENCOUNTER — Other Ambulatory Visit: Payer: Self-pay

## 2020-08-10 DIAGNOSIS — Z23 Encounter for immunization: Secondary | ICD-10-CM | POA: Diagnosis not present

## 2020-09-14 ENCOUNTER — Other Ambulatory Visit: Payer: Self-pay

## 2020-09-14 ENCOUNTER — Ambulatory Visit: Payer: BC Managed Care – PPO | Admitting: Family Medicine

## 2020-09-14 DIAGNOSIS — Z9109 Other allergy status, other than to drugs and biological substances: Secondary | ICD-10-CM

## 2020-09-14 MED ORDER — FEXOFENADINE HCL 180 MG PO TABS: 180.0000 mg | ORAL_TABLET | Freq: Every day | ORAL | 0 refills | Status: DC

## 2020-09-22 ENCOUNTER — Other Ambulatory Visit: Payer: Self-pay

## 2020-09-22 ENCOUNTER — Other Ambulatory Visit: Payer: Self-pay | Admitting: *Deleted

## 2020-09-22 DIAGNOSIS — Z9109 Other allergy status, other than to drugs and biological substances: Secondary | ICD-10-CM

## 2020-09-22 MED ORDER — TRIAMCINOLONE ACETONIDE 0.1 % EX OINT: TOPICAL_OINTMENT | CUTANEOUS | 0 refills | Status: DC

## 2020-09-22 MED ORDER — FEXOFENADINE HCL 180 MG PO TABS: 180.0000 mg | ORAL_TABLET | Freq: Every day | ORAL | 0 refills | Status: DC

## 2020-10-06 DIAGNOSIS — F4310 Post-traumatic stress disorder, unspecified: Secondary | ICD-10-CM | POA: Diagnosis not present

## 2020-10-13 DIAGNOSIS — F4310 Post-traumatic stress disorder, unspecified: Secondary | ICD-10-CM | POA: Diagnosis not present

## 2020-10-18 DIAGNOSIS — Z31430 Encounter of female for testing for genetic disease carrier status for procreative management: Secondary | ICD-10-CM | POA: Diagnosis not present

## 2020-10-19 DIAGNOSIS — N8500 Endometrial hyperplasia, unspecified: Secondary | ICD-10-CM | POA: Diagnosis not present

## 2020-10-19 DIAGNOSIS — Z113 Encounter for screening for infections with a predominantly sexual mode of transmission: Secondary | ICD-10-CM | POA: Diagnosis not present

## 2020-10-27 DIAGNOSIS — F4310 Post-traumatic stress disorder, unspecified: Secondary | ICD-10-CM | POA: Diagnosis not present

## 2020-10-28 DIAGNOSIS — Z113 Encounter for screening for infections with a predominantly sexual mode of transmission: Secondary | ICD-10-CM | POA: Diagnosis not present

## 2020-11-03 DIAGNOSIS — F4310 Post-traumatic stress disorder, unspecified: Secondary | ICD-10-CM | POA: Diagnosis not present

## 2020-11-14 DIAGNOSIS — N979 Female infertility, unspecified: Secondary | ICD-10-CM | POA: Diagnosis not present

## 2020-11-14 DIAGNOSIS — Z31430 Encounter of female for testing for genetic disease carrier status for procreative management: Secondary | ICD-10-CM | POA: Diagnosis not present

## 2020-11-14 DIAGNOSIS — Z319 Encounter for procreative management, unspecified: Secondary | ICD-10-CM | POA: Diagnosis not present

## 2020-11-14 DIAGNOSIS — Z113 Encounter for screening for infections with a predominantly sexual mode of transmission: Secondary | ICD-10-CM | POA: Diagnosis not present

## 2020-12-05 DIAGNOSIS — F418 Other specified anxiety disorders: Secondary | ICD-10-CM

## 2020-12-05 MED ORDER — TRAZODONE HCL 50 MG PO TABS: ORAL_TABLET | ORAL | 5 refills | Status: DC

## 2020-12-08 DIAGNOSIS — F4310 Post-traumatic stress disorder, unspecified: Secondary | ICD-10-CM | POA: Diagnosis not present

## 2020-12-22 DIAGNOSIS — F4310 Post-traumatic stress disorder, unspecified: Secondary | ICD-10-CM | POA: Diagnosis not present

## 2020-12-30 ENCOUNTER — Other Ambulatory Visit: Payer: Self-pay | Admitting: Allergy & Immunology

## 2020-12-30 DIAGNOSIS — Z9109 Other allergy status, other than to drugs and biological substances: Secondary | ICD-10-CM

## 2021-01-02 ENCOUNTER — Other Ambulatory Visit: Payer: Self-pay | Admitting: *Deleted

## 2021-01-11 DIAGNOSIS — F4310 Post-traumatic stress disorder, unspecified: Secondary | ICD-10-CM | POA: Diagnosis not present

## 2021-01-25 DIAGNOSIS — F4310 Post-traumatic stress disorder, unspecified: Secondary | ICD-10-CM | POA: Diagnosis not present

## 2021-02-08 DIAGNOSIS — F4310 Post-traumatic stress disorder, unspecified: Secondary | ICD-10-CM | POA: Diagnosis not present

## 2021-03-10 DIAGNOSIS — F4310 Post-traumatic stress disorder, unspecified: Secondary | ICD-10-CM | POA: Diagnosis not present

## 2021-03-18 IMAGING — MG DIGITAL SCREENING BILAT W/ TOMO W/ CAD
6 of 12 series · 6 of 36 positions shown · non-contrast
Comparison: None.

CLINICAL DATA: Screening.

EXAM:
DIGITAL SCREENING BILATERAL MAMMOGRAM WITH TOMO AND CAD

[R XCCL synth-2D]
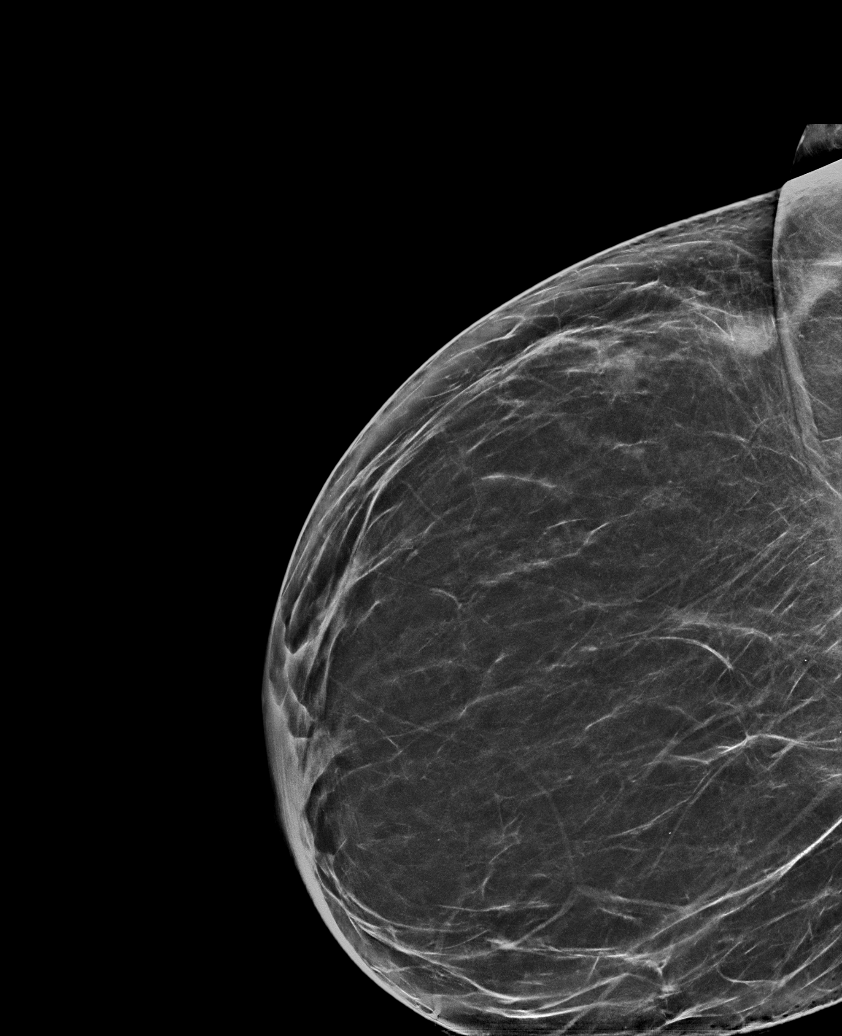

[R CC synth-2D]
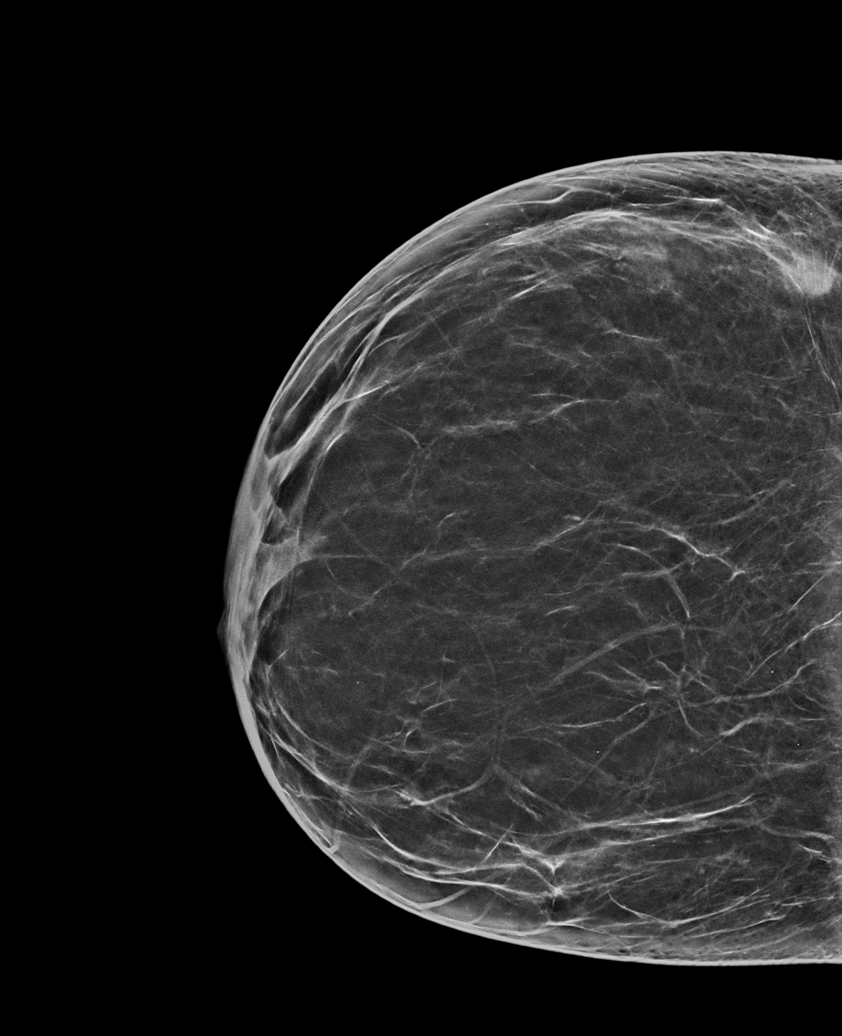

[L MLO synth-2D (1 of 2)]
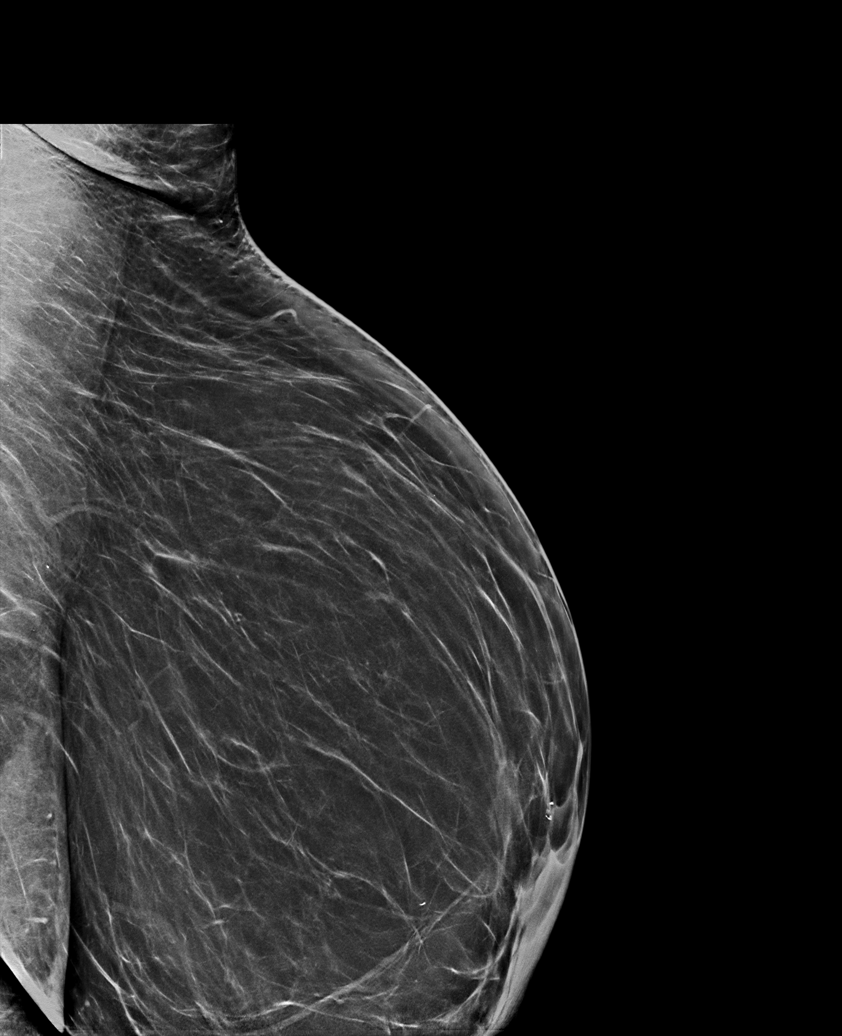

[L MLO synth-2D (2 of 2)]
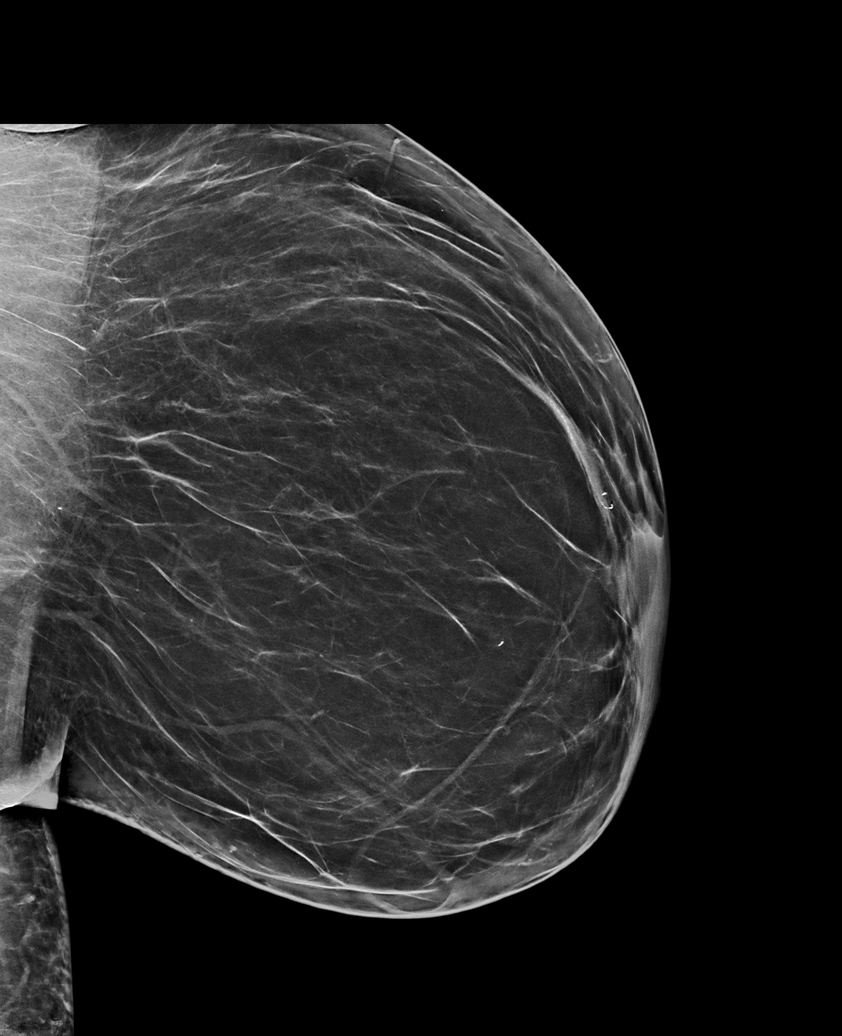

[L CC synth-2D]
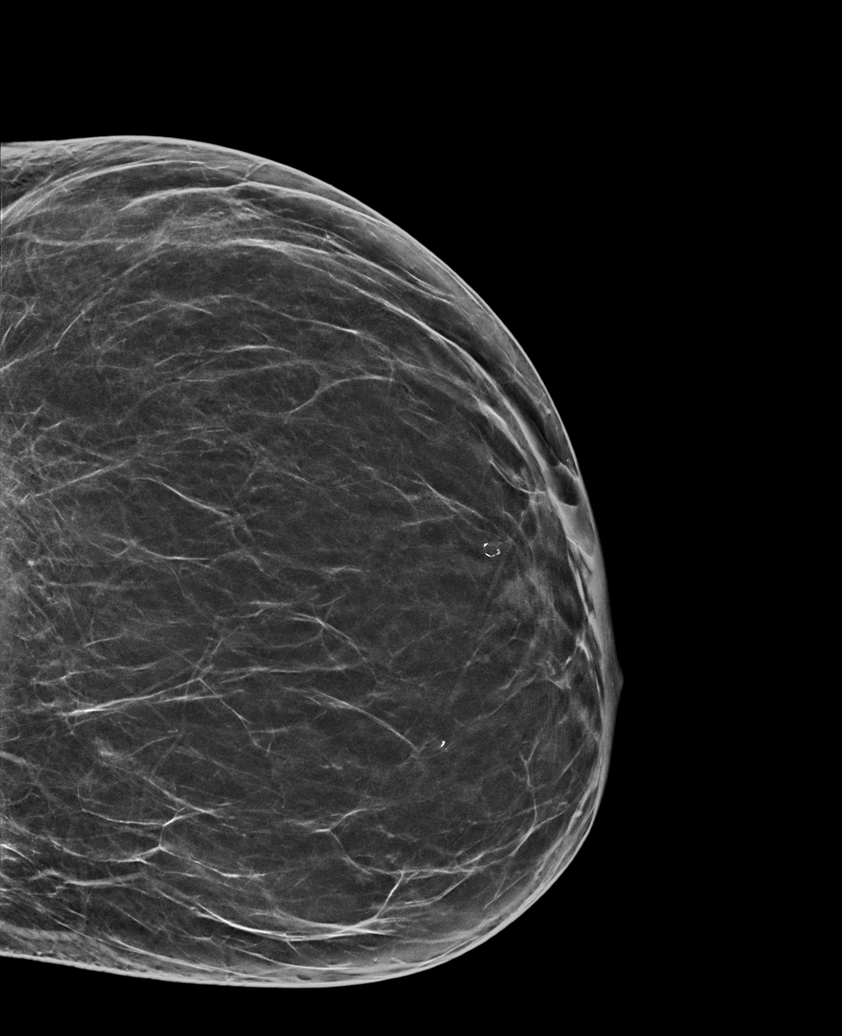

[R MLO synth-2D]
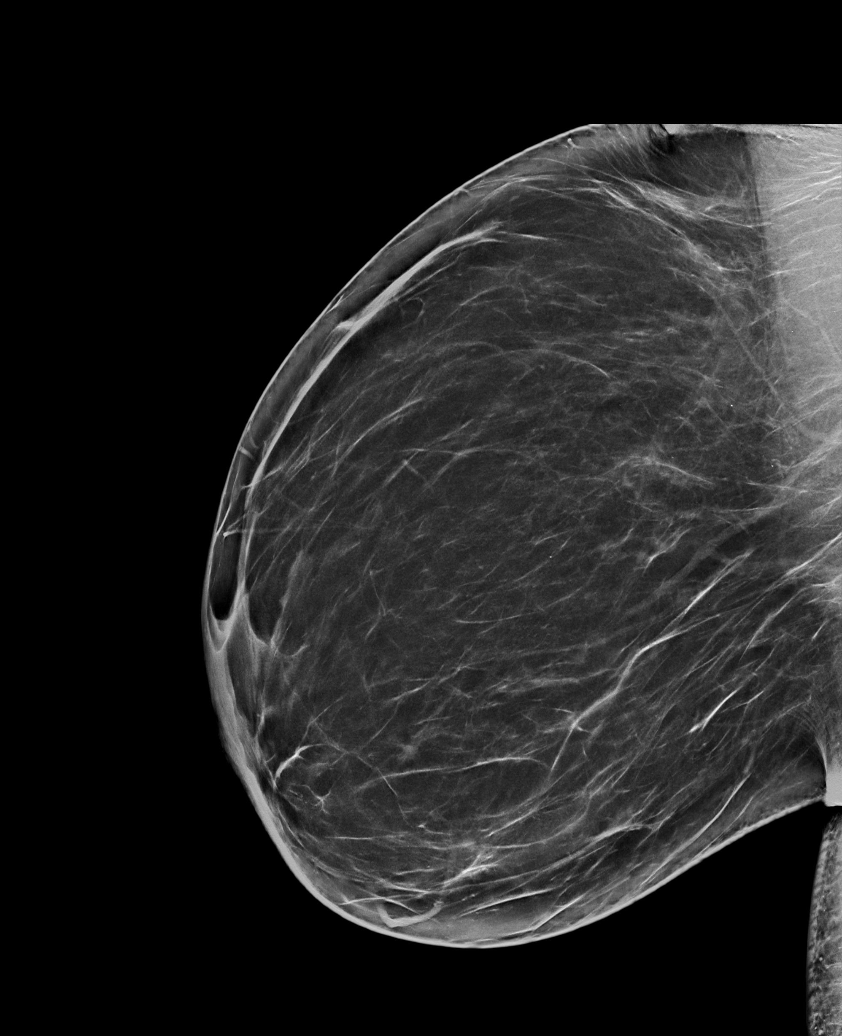

[6 of 36 positions shown; findings below may reference images not displayed]

ACR Breast Density Category b: There are scattered areas of
fibroglandular density.
FINDINGS: There are no findings suspicious for malignancy. Images were
processed with CAD.
IMPRESSION: No mammographic evidence of malignancy. A result letter of this
screening mammogram will be mailed directly to the patient.

RECOMMENDATION:
Screening mammogram in one year. (Code:Y5-G-EJ6)

BI-RADS CATEGORY  1: Negative.

## 2021-03-22 DIAGNOSIS — F4310 Post-traumatic stress disorder, unspecified: Secondary | ICD-10-CM | POA: Diagnosis not present

## 2021-03-28 ENCOUNTER — Telehealth: Payer: Self-pay | Admitting: Allergy & Immunology

## 2021-03-28 MED ORDER — MONTELUKAST SODIUM 10 MG PO TABS: 10.0000 mg | ORAL_TABLET | Freq: Every day | ORAL | 1 refills | Status: DC

## 2021-03-28 NOTE — Telephone Encounter (Signed)
Sent in refill of montelukast to [facility]

## 2021-03-28 NOTE — Telephone Encounter (Signed)
Patient called  and made appointment for 06/06/2021 at 5:30 pm. And needs to have the singulair call into [facility].[phone].

## 2021-04-05 DIAGNOSIS — F4310 Post-traumatic stress disorder, unspecified: Secondary | ICD-10-CM | POA: Diagnosis not present

## 2021-04-12 ENCOUNTER — Encounter: Payer: Self-pay | Admitting: Family Medicine

## 2021-04-12 ENCOUNTER — Ambulatory Visit (INDEPENDENT_AMBULATORY_CARE_PROVIDER_SITE_OTHER): Payer: 59 | Admitting: Family Medicine

## 2021-04-12 ENCOUNTER — Other Ambulatory Visit: Payer: Self-pay

## 2021-04-12 VITALS — BP 118/62 | HR 92 | Temp 97.6°F | Ht 68.0 in | Wt 226.4 lb

## 2021-04-12 DIAGNOSIS — L74510 Primary focal hyperhidrosis, axilla: Secondary | ICD-10-CM

## 2021-04-12 DIAGNOSIS — M797 Fibromyalgia: Secondary | ICD-10-CM | POA: Diagnosis not present

## 2021-04-12 DIAGNOSIS — F418 Other specified anxiety disorders: Secondary | ICD-10-CM

## 2021-04-12 DIAGNOSIS — Z720 Tobacco use: Secondary | ICD-10-CM | POA: Diagnosis not present

## 2021-04-12 MED ORDER — ALUMINUM CHLORIDE 20 % EX SOLN: Freq: Every day | CUTANEOUS | 0 refills | Status: DC

## 2021-04-12 MED ORDER — DULOXETINE HCL 60 MG PO CPEP: 60.0000 mg | ORAL_CAPSULE | Freq: Every day | ORAL | 2 refills | Status: DC

## 2021-04-12 MED ORDER — BUSPIRONE HCL 15 MG PO TABS: 15.0000 mg | ORAL_TABLET | Freq: Two times a day (BID) | ORAL | 2 refills | Status: DC

## 2021-04-12 NOTE — Patient Instructions (Signed)
Let me know if you change your mind with a nicotine replacement.  Let me know if there are cost issues with the Drysol. I would try this prior to Botox.   Let us know if you need anything.

## 2021-04-12 NOTE — Progress Notes (Signed)
Chief Complaint  Patient presents with   Follow-up    Sweating Increase in smoking since last appointment    Subjective: Patient is a 45 y.o. female here for sweating.  She has been sweating more during summers over the past several years.  It is mainly affecting her armpit regions.  She is tried various deodorants but notes there is also an onion-like smell.  It is directly correlated with heat exposure.  She does not have the issue anywhere else in her body at this time.  Patient is currently smoking 1 pack/week.  Usually associated with stress or boredom/convenience during a break while working.  She has never tried any nicotine replacement supplementation.  Patient is taking BuSpar and duloxetine for both fibromyalgia and anxiety/depression.  It is working well.  She reports compliance and no adverse effects.  She is exercising routinely.  She has started to swim.  No homicidal or suicidal ideation.  Past Medical History:  Diagnosis Date   Anxiety with depression 07/01/2017   Fibromyalgia    Vaginal Pap smear, abnormal     Objective: BP 118/62   Pulse 92   Temp 97.6 F (36.4 C) (Oral)   Ht 5\' 8"  (1.727 m)   Wt 226 lb 6 oz (102.7 kg)   SpO2 99%   BMI 34.42 kg/m  General: Awake, appears stated age HEENT: MMM, EOMi Heart: RRR, no lower extremity edema Lungs: CTAB, no rales, wheezes or rhonchi. No accessory muscle use Psych: Age appropriate judgment and insight, normal affect and mood  Assessment and Plan: Hyperhidrosis of axilla - Plan: aluminum chloride (DRYSOL) 20 % external solution  Tobacco abuse  Fibromyalgia - Plan: DULoxetine (CYMBALTA) 60 MG capsule  Anxiety with depression - Plan: busPIRone (BUSPAR) 15 MG tablet  Chronic, uncontrolled.  Start Drysol nightly.  Let me know if there are cost issues.   We discussed nicotine replacement but she declined at this time as she is not smoking that much.  We discussed behavioral modifications. Chronic, stable.   Continue duloxetine 60 mg daily. Continue BuSpar 15 mg twice daily. Follow-up in 6 months for physical or as needed. The patient voiced understanding and agreement to the plan.  [place], DO 04/12/21  10:15 AM

## 2021-04-21 DIAGNOSIS — F4310 Post-traumatic stress disorder, unspecified: Secondary | ICD-10-CM | POA: Diagnosis not present

## 2021-05-09 ENCOUNTER — Other Ambulatory Visit: Payer: Self-pay | Admitting: Family Medicine

## 2021-05-09 ENCOUNTER — Other Ambulatory Visit: Payer: Self-pay | Admitting: Allergy & Immunology

## 2021-05-09 DIAGNOSIS — F418 Other specified anxiety disorders: Secondary | ICD-10-CM

## 2021-05-10 NOTE — Telephone Encounter (Signed)
Last RF-- 12/05/20 #30 with 5 refills.

## 2021-05-22 ENCOUNTER — Other Ambulatory Visit: Payer: Self-pay | Admitting: Family Medicine

## 2021-05-22 DIAGNOSIS — L74510 Primary focal hyperhidrosis, axilla: Secondary | ICD-10-CM

## 2021-06-01 ENCOUNTER — Other Ambulatory Visit: Payer: Self-pay

## 2021-06-01 ENCOUNTER — Encounter: Payer: Self-pay | Admitting: Family Medicine

## 2021-06-01 ENCOUNTER — Other Ambulatory Visit (HOSPITAL_COMMUNITY)
Admission: RE | Admit: 2021-06-01 | Discharge: 2021-06-01 | Disposition: A | Discharge status: Home / Self Care | Payer: 59 | Source: Ambulatory Visit | Attending: Family Medicine | Admitting: Family Medicine

## 2021-06-01 ENCOUNTER — Ambulatory Visit (INDEPENDENT_AMBULATORY_CARE_PROVIDER_SITE_OTHER): Payer: 59 | Admitting: Family Medicine

## 2021-06-01 VITALS — BP 127/71 | HR 76 | Ht 67.0 in | Wt 236.0 lb

## 2021-06-01 DIAGNOSIS — Z7721 Contact with and (suspected) exposure to potentially hazardous body fluids: Secondary | ICD-10-CM | POA: Diagnosis not present

## 2021-06-01 DIAGNOSIS — R87612 Low grade squamous intraepithelial lesion on cytologic smear of cervix (LGSIL): Secondary | ICD-10-CM | POA: Diagnosis not present

## 2021-06-01 DIAGNOSIS — Z01419 Encounter for gynecological examination (general) (routine) without abnormal findings: Secondary | ICD-10-CM | POA: Insufficient documentation

## 2021-06-01 NOTE — Progress Notes (Signed)
GYNECOLOGY ANNUAL PREVENTATIVE CARE ENCOUNTER NOTE  Subjective:   Kerri Guerra is a 45 y.o. G39P0010 female here for a routine annual gynecologic exam.  Current complaints: none. Husband had affair. Concerned about STIs.   Denies abnormal vaginal bleeding, discharge, pelvic pain, problems with intercourse or other gynecologic concerns.    Gynecologic History Patient's last menstrual period was 05/24/2021. Patient is sexually active  Contraception: condoms Last Pap: 2020. Results were: LSIL. Did not have colposcopy Last mammogram: 2020. Results were: birad 1 Colorectal Cancer Screening: n/a.  Obstetric History OB History  Gravida Para Term Preterm AB Living  1       1    SAB IAB Ectopic Multiple Live Births  1            # Outcome Date GA Lbr Len/2nd Weight Sex Delivery Anes PTL Lv  1 SAB 04/08/17 [redacted]w[redacted]d           Past Medical History:  Diagnosis Date   Anxiety with depression 07/01/2017   Fibromyalgia    Vaginal Pap smear, abnormal     Past Surgical History:  Procedure Laterality Date   COLPOSCOPY      Current Outpatient Medications on File Prior to Visit  Medication Sig Dispense Refill   busPIRone (BUSPAR) 15 MG tablet Take 1 tablet (15 mg total) by mouth 2 (two) times daily. 180 tablet 2   DRYSOL 20 % external solution Apply topically at bedtime. 60 mL 0   DULoxetine (CYMBALTA) 60 MG capsule Take 1 capsule (60 mg total) by mouth daily. 90 capsule 2   fexofenadine (ALLEGRA) 180 MG tablet Take 1 tablet (180 mg total) by mouth daily. 15 tablet 0   ibuprofen (ADVIL,MOTRIN) 400 MG tablet Take 400 mg by mouth every 6 (six) hours as needed.      montelukast (SINGULAIR) 10 MG tablet Take 1 tablet (10 mg total) by mouth daily. 30 tablet 1   traZODone (DESYREL) 50 MG tablet Take 1/2 to 1 tablet by mouth at bedtime as needed for sleep. 30 tablet 11   triamcinolone ointment (KENALOG) 0.1 % Apply in the ear canal with a q-tip twice daily as directed. 80 g 0   clotrimazole  (LOTRIMIN) 1 % cream Apply 1 application topically 2 (two) times daily. (Patient not taking: Reported on 06/01/2021) 30 g 0   lactobacillus acidophilus (BACID) TABS tablet Take 2 tablets by mouth 3 (three) times daily. (Patient not taking: Reported on 06/01/2021)     Prenatal Vit-Fe Fumarate-FA (PRENATAL VITAMINS PO) Take by mouth.  (Patient not taking: Reported on 06/01/2021)     No current facility-administered medications on file prior to visit.    No Known Allergies  Social History   Socioeconomic History   Marital status: Married    Spouse name: Not on file   Number of children: Not on file   Years of education: Not on file   Highest education level: Not on file  Occupational History   Not on file  Tobacco Use   Smoking status: Every Day    Packs/day: 0.50    Types: Cigarettes   Smokeless tobacco: Never  Vaping Use   Vaping Use: Former  Substance and Sexual Activity   Alcohol use: Yes    Alcohol/week: 3.0 standard drinks    Types: 3 Standard drinks or equivalent per week   Drug use: No   Sexual activity: Yes  Other Topics Concern   Not on file  Social History Narrative   Not on  file   Social Determinants of Health   Financial Resource Strain: Not on file  Food Insecurity: Not on file  Transportation Needs: Not on file  Physical Activity: Not on file  Stress: Not on file  Social Connections: Not on file  Intimate Partner Violence: Not on file    Family History  Problem Relation Age of Onset   Cancer Maternal Grandmother    Cancer Father        PROSTATE   Diabetes Mother    Hypertension Mother    Stroke Neg Hx    Allergic rhinitis Neg Hx    Angioedema Neg Hx    Atopy Neg Hx    Asthma Neg Hx    Immunodeficiency Neg Hx    Eczema Neg Hx    Urticaria Neg Hx     The following portions of the patient's history were reviewed and updated as appropriate: allergies, current medications, past family history, past medical history, past social history, past surgical  history and problem list.  Review of Systems Pertinent items are noted in HPI.   Objective:  BP 127/71   Pulse 76   Ht 5\' 7"  (1.702 m)   Wt 236 lb (107 kg)   LMP 05/24/2021   BMI 36.96 kg/m  Wt Readings from Last 3 Encounters:  06/01/21 236 lb (107 kg)  04/12/21 226 lb 6 oz (102.7 kg)  05/20/20 249 lb (112.9 kg)     Chaperone present during exam  CONSTITUTIONAL: Well-developed, well-nourished female in no acute distress.  HENT:  Normocephalic, atraumatic, External right and left ear normal. Oropharynx is clear and moist EYES: Conjunctivae and EOM are normal. Pupils are equal, round, and reactive to light. No scleral icterus.  NECK: Normal range of motion, supple, no masses.  Normal thyroid.   CARDIOVASCULAR: Normal heart rate noted, regular rhythm RESPIRATORY: Clear to auscultation bilaterally. Effort and breath sounds normal, no problems with respiration noted. BREASTS: Symmetric in size. No masses, skin changes, nipple drainage, or lymphadenopathy. ABDOMEN: Soft, normal bowel sounds, no distention noted.  No tenderness, rebound or guarding.  PELVIC: Normal appearing external genitalia; normal appearing vaginal mucosa and cervix.  No abnormal discharge noted.  MUSCULOSKELETAL: Normal range of motion. No tenderness.  No cyanosis, clubbing, or edema.  2+ distal pulses. SKIN: Skin is warm and dry. No rash noted. Not diaphoretic. No erythema. No pallor. NEUROLOGIC: Alert and oriented to person, place, and time. Normal reflexes, muscle tone coordination. No cranial nerve deficit noted. PSYCHIATRIC: Normal mood and affect. Normal behavior. Normal judgment and thought content.  Assessment:  Annual gynecologic examination with pap smear   Plan:  1. Well Woman Exam Will follow up results of pap smear and manage accordingly. Mammogram scheduled STD testing discussed. Patient requested testing   2. Exposure to body fluid - HIV antibody (with reflex) - RPR - Hepatitis C  Antibody - Hepatitis B Surface AntiGEN  3. Low grade squamous intraepithelial lesion on cytologic smear of cervix (LGSIL) PAP today.  Schedule for colpo   Routine preventative health maintenance measures emphasized. Please refer to After Visit Summary for other counseling recommendations.    05/22/20, DO [facility]

## 2021-06-02 LAB — RPR: RPR Ser Ql: NONREACTIVE

## 2021-06-02 LAB — HIV ANTIBODY (ROUTINE TESTING W REFLEX): HIV Screen 4th Generation wRfx: NONREACTIVE

## 2021-06-02 LAB — HEPATITIS C ANTIBODY: Hep C Virus Ab: <0.1 s/co ratio (ref 0.0–0.9)

## 2021-06-02 LAB — HEPATITIS B SURFACE ANTIGEN: Hepatitis B Surface Ag: NEGATIVE

## 2021-06-06 ENCOUNTER — Ambulatory Visit (INDEPENDENT_AMBULATORY_CARE_PROVIDER_SITE_OTHER): Payer: 59 | Admitting: Allergy & Immunology

## 2021-06-06 ENCOUNTER — Other Ambulatory Visit: Payer: Self-pay

## 2021-06-06 ENCOUNTER — Encounter: Payer: Self-pay | Admitting: Allergy & Immunology

## 2021-06-06 DIAGNOSIS — Z9109 Other allergy status, other than to drugs and biological substances: Secondary | ICD-10-CM

## 2021-06-06 MED ORDER — FEXOFENADINE HCL 180 MG PO TABS: 180.0000 mg | ORAL_TABLET | Freq: Every day | ORAL | 1 refills | Status: DC

## 2021-06-06 MED ORDER — MONTELUKAST SODIUM 10 MG PO TABS: 10.0000 mg | ORAL_TABLET | Freq: Every day | ORAL | 1 refills | Status: DC

## 2021-06-06 NOTE — Patient Instructions (Addendum)
1. Season and perennial allergic rhinitis (trees, weeds, grasses, indoor molds, outdoor molds, dust mites, cat, dog and cockroach) - Continue with montelukast 10 mg once a day. - Continue with alternating Allegra or Claritin daily (you can use it twice daily on particularly bad days). - Continue with triamcinolone 0.1% ointment applied via Q-tip twice daily to the ear canals as needed for itching.   2. Return in about 1 year (around 06/06/2022).    Please inform us of any Emergency Department visits, hospitalizations, or changes in symptoms. Call us before going to the ED for breathing or allergy symptoms since we might be able to fit you in for a sick visit. Feel free to contact us anytime with any questions, problems, or concerns.  It was a pleasure to see you again today!   Websites that have reliable patient information: 1. American Academy of Asthma, Allergy, and Immunology: www.aaaai.org 2. Food Allergy Research and Education (FARE): foodallergy.org 3. Mothers of Asthmatics: http://www.asthmacommunitynetwork.org 4. American College of Allergy, Asthma, and Immunology: www.acaai.org  "Like" [place] on Facebook and Instagram for our latest updates!        Make sure you are registered to vote! If you have moved or changed any of your contact information, you will need to get this updated before voting!  In some cases, you MAY be able to register to vote online: [email or web address]

## 2021-06-06 NOTE — Progress Notes (Signed)
FOLLOW UP  Date of Service/Encounter:  06/06/21   Assessment:   Seasonal and perennial allergic rhinitis (trees, weeds, grasses, indoor molds, outdoor molds, dust mites, cat, dog and cockroach)  Plan/Recommendations:    1. Season and perennial allergic rhinitis (trees, weeds, grasses, indoor molds, outdoor molds, dust mites, cat, dog and cockroach) - Continue with montelukast 10 mg once a day. - Continue with alternating Allegra or Claritin daily (you can use it twice daily on particularly bad days). - Continue with triamcinolone 0.1% ointment applied via Q-tip twice daily to the ear canals as needed for itching.   2. Return in about 1 year (around 06/06/2022).    Subjective:   Kerri Guerra is a 45 y.o. 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  Kerri Guerra is a 45 y.o. female presenting for a follow up visit.  She was last seen in December 2020.  At that time, we continue with montelukast as well as Allegra.  We added on triamcinolone to the ear canals for itching.  We did discuss using all of oil to keep the ears clear of wax.  Since the last visit, she has mostly done well.   Allergic Rhinitis Symptom History: She has been alternating between Claritin and the Allegra and she has been using Singulair at night. She has been doing more regular use  with that. She does sometimes skip the morning antihistamine. She has not been using a nose spray at all. She does not feel that she needs to use her nose spray at all.   She has not needed antibiotics at all since I saw her. She has been using hot water to clean her sheets.  Overall, she is doing very well.  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 This is all Zoom based and she can work from home. 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 This was at [employer] where she used to work before this current job.   She did go to the [place] at the beginning of the summer.  She seemed to have a good time with that.  Otherwise, there have been no changes to her past medical history, surgical history, family history, or social history.    Review of Systems  Constitutional: Negative.  Negative for chills, fever, malaise/fatigue and weight loss.  HENT:  Negative for congestion, ear discharge, ear pain and sinus pain.   Eyes:  Negative for pain, discharge and redness.  Respiratory:  Negative for cough, sputum production, shortness of breath and wheezing.   Cardiovascular: Negative.  Negative for chest pain and palpitations.  Gastrointestinal:  Negative for abdominal pain, constipation, diarrhea, heartburn, nausea and vomiting.  Skin: Negative.  Negative for itching and rash.  Neurological:  Negative for dizziness and headaches.  Endo/Heme/Allergies:  Negative for environmental allergies. Does not bruise/bleed easily.  Objective:   Blood pressure 126/78, pulse 86, temperature 98.7 F (37.1 C), temperature source Temporal, resp. rate 16, height 5\' 7"  (1.702 m), weight 237 lb 8 oz (107.7 kg), last menstrual period 05/24/2021, SpO2 98 %. Body mass index is 37.2 kg/m.   Physical Exam:  Physical Exam Vitals reviewed.  Constitutional:      Appearance: She is well-developed.  HENT:     Head: Normocephalic and atraumatic.     Right Ear: Tympanic  membrane, ear canal and external ear normal.     Left Ear: Tympanic membrane, ear canal and external ear normal.     Nose: No nasal deformity, septal deviation, mucosal edema or rhinorrhea.     Right Turbinates: Enlarged and swollen.     Left Turbinates: Enlarged and swollen.     Right Sinus: No maxillary sinus tenderness or frontal sinus tenderness.     Left Sinus: No maxillary sinus tenderness or frontal sinus tenderness.     Mouth/Throat:     Mouth: Mucous membranes are not pale and not dry.     Pharynx: Uvula midline.  Eyes:     General: Lids are normal. No allergic shiner.       Right eye: No discharge.        Left eye: No discharge.     Conjunctiva/sclera: Conjunctivae normal.     Right eye: Right conjunctiva is not injected. No chemosis.    Left eye: Left conjunctiva is not injected. No chemosis.    Pupils: Pupils are equal, round, and reactive to light.  Cardiovascular:     Rate and Rhythm: Normal rate and regular rhythm.     Heart sounds: Normal heart sounds.  Pulmonary:     Effort: Pulmonary effort is normal. No tachypnea, accessory muscle usage or respiratory distress.     Breath sounds: Normal breath sounds. No wheezing, rhonchi or rales.  Chest:     Chest wall: No tenderness.  Lymphadenopathy:     Cervical: No cervical adenopathy.  Skin:    Coloration: Skin is not pale.     Findings: No abrasion, erythema, petechiae or rash. Rash is not papular, urticarial or vesicular.  Neurological:     Mental Status: She is alert.  Psychiatric:        Behavior: Behavior is cooperative.     Diagnostic studies: none       05/26/2021, MD  [facility]

## 2021-06-13 LAB — CYTOLOGY - PAP
Chlamydia: NEGATIVE
Comment: NEGATIVE
Comment: NEGATIVE
Comment: NEGATIVE
Comment: NORMAL
Diagnosis: UNDETERMINED — AB
HPV 16: NEGATIVE
HPV 18 / 45: NEGATIVE
High risk HPV: POSITIVE — AB
Neisseria Gonorrhea: NEGATIVE

## 2021-06-29 DIAGNOSIS — F4310 Post-traumatic stress disorder, unspecified: Secondary | ICD-10-CM | POA: Diagnosis not present

## 2021-07-04 ENCOUNTER — Encounter (HOSPITAL_BASED_OUTPATIENT_CLINIC_OR_DEPARTMENT_OTHER): Payer: Self-pay

## 2021-07-04 ENCOUNTER — Ambulatory Visit (HOSPITAL_BASED_OUTPATIENT_CLINIC_OR_DEPARTMENT_OTHER)
Admission: RE | Admit: 2021-07-04 | Discharge: 2021-07-04 | Disposition: A | Discharge status: Home / Self Care | Payer: 59 | Source: Ambulatory Visit | Attending: Family Medicine | Admitting: Family Medicine

## 2021-07-04 ENCOUNTER — Other Ambulatory Visit: Payer: Self-pay

## 2021-07-04 DIAGNOSIS — Z1231 Encounter for screening mammogram for malignant neoplasm of breast: Secondary | ICD-10-CM | POA: Diagnosis not present

## 2021-07-04 DIAGNOSIS — Z01419 Encounter for gynecological examination (general) (routine) without abnormal findings: Secondary | ICD-10-CM | POA: Insufficient documentation

## 2021-07-19 ENCOUNTER — Encounter: Payer: 59 | Admitting: Family Medicine

## 2021-07-19 ENCOUNTER — Other Ambulatory Visit (HOSPITAL_BASED_OUTPATIENT_CLINIC_OR_DEPARTMENT_OTHER): Payer: Self-pay

## 2021-07-19 MED ORDER — INFLUENZA VAC SPLIT QUAD 0.5 ML IM SUSY
PREFILLED_SYRINGE | INTRAMUSCULAR | 0 refills | Status: DC
  Filled 2021-07-19: qty 0.5, 1d supply, fill #0

## 2021-07-26 ENCOUNTER — Ambulatory Visit (INDEPENDENT_AMBULATORY_CARE_PROVIDER_SITE_OTHER): Payer: 59 | Admitting: Family Medicine

## 2021-07-26 ENCOUNTER — Other Ambulatory Visit (HOSPITAL_COMMUNITY)
Admission: RE | Admit: 2021-07-26 | Discharge: 2021-07-26 | Disposition: A | Discharge status: Home / Self Care | Payer: 59 | Source: Ambulatory Visit | Attending: Family Medicine | Admitting: Family Medicine

## 2021-07-26 ENCOUNTER — Other Ambulatory Visit: Payer: Self-pay

## 2021-07-26 ENCOUNTER — Encounter: Payer: Self-pay | Admitting: Family Medicine

## 2021-07-26 ENCOUNTER — Encounter: Payer: Self-pay | Admitting: General Practice

## 2021-07-26 VITALS — BP 117/80 | HR 65 | Wt 246.0 lb

## 2021-07-26 DIAGNOSIS — R87610 Atypical squamous cells of undetermined significance on cytologic smear of cervix (ASC-US): Secondary | ICD-10-CM

## 2021-07-26 DIAGNOSIS — N870 Mild cervical dysplasia: Secondary | ICD-10-CM

## 2021-07-26 DIAGNOSIS — Z01812 Encounter for preprocedural laboratory examination: Secondary | ICD-10-CM | POA: Diagnosis not present

## 2021-07-26 LAB — POCT URINE PREGNANCY: Preg Test, Ur: NEGATIVE

## 2021-07-26 NOTE — Progress Notes (Signed)
Patient Name: Kerri Guerra, female   DOB: 1976-04-18, 45 y.o.  MRN: [id number]  Colposcopy Procedure Note:  G1P0010 Pregnancy status: Unknown Lab Results  Component Value Date   DIAGPAP (A) 06/01/2021    - Atypical squamous cells of undetermined significance (ASC-US)   DIAGPAP - Low grade squamous intraepithelial lesion (LSIL) (A) 08/13/2019   DIAGPAP (A) 07/01/2017    ATYPICAL SQUAMOUS CELLS OF UNDETERMINED SIGNIFICANCE (ASC-US).   HPV DETECTED (A) 07/01/2017   HPVHIGH Positive (A) 06/01/2021   HPVHIGH Positive (A) 08/13/2019    Cervical History: Previous Colposcopy: 09/2019 - CIN1, normal ECC Previous LEEP or Cryo: none  Smoking: Former Smoker Hysterectomy: No Other History:   Patient given informed consent, signed copy in the chart, time out was performed.    Exam: Vulva and Vagina grossly normal.  Cervix viewed with speculum and colposcope after application of acetic acid:  Cervix Fully Visualized Squamocolumnar Junction Visibility: Fully visualized  Acetowhite lesions: 4 and 9 o'clock  Other Lesions: None Punctation: Not present  Mosaicism: Not present Abnormal vasculature: No   Biopsies: 4 and 9 o'clock ECC: Yes - Curette and Brush  Hemostasis achieved with:  Monsel's Solution  Colposcopy Impression:  CIN1   Patient was given post procedure instructions.  Will call patient with results.

## 2021-07-27 LAB — SURGICAL PATHOLOGY

## 2021-08-15 ENCOUNTER — Other Ambulatory Visit: Payer: Self-pay

## 2021-08-15 ENCOUNTER — Other Ambulatory Visit (HOSPITAL_BASED_OUTPATIENT_CLINIC_OR_DEPARTMENT_OTHER): Payer: Self-pay

## 2021-08-15 ENCOUNTER — Encounter: Payer: Self-pay | Admitting: Family Medicine

## 2021-08-15 ENCOUNTER — Telehealth (INDEPENDENT_AMBULATORY_CARE_PROVIDER_SITE_OTHER): Payer: 59 | Admitting: Family Medicine

## 2021-08-15 DIAGNOSIS — Z720 Tobacco use: Secondary | ICD-10-CM

## 2021-08-15 MED ORDER — BUPROPION HCL ER (SMOKING DET) 150 MG PO TB12
ORAL_TABLET | ORAL | 2 refills | Status: DC
  Filled 2021-08-15: qty 60, 30d supply, fill #0
  Filled 2021-09-18: qty 60, 30d supply, fill #1

## 2021-08-15 NOTE — Progress Notes (Signed)
Chief Complaint  Patient presents with   Follow-up    Discuss medication    Subjective: Patient is a 46 y.o. female here for smoking cessation. Due to COVID-19 pandemic, we are interacting via web portal for an electronic face-to-face visit. I verified patient's ID using 2 identifiers. Patient agreed to proceed with visit via this method. Patient is at home, I am at office. Patient and I are present for visit.   Smokes 1-6 cigarettes daily. Has smoked for 2 years. No SOB or coughing. Has never tried anything to help her quit smoking. Anxiety/boredom make it more likely for her to smoke.   Past Medical History:  Diagnosis Date   Anxiety with depression 07/01/2017   Fibromyalgia    Vaginal Pap smear, abnormal     Objective: No conversational dyspnea Age appropriate judgment and insight Nml affect and mood  Assessment and Plan: Tobacco abuse - Plan: buPROPion (ZYBAN) 150 MG 12 hr tablet  Chronic, uncontrolled. Start Bupropion 150 mg/d for 3 d then bid. Start 1 week prior to quit date. F/u as originally scheduled in 2 mo.   The patient voiced understanding and agreement to the plan.  Jilda Roche [place], DO 08/15/21  1:47 PM

## 2021-08-16 ENCOUNTER — Other Ambulatory Visit (HOSPITAL_BASED_OUTPATIENT_CLINIC_OR_DEPARTMENT_OTHER): Payer: Self-pay

## 2021-09-01 DIAGNOSIS — N760 Acute vaginitis: Secondary | ICD-10-CM | POA: Diagnosis not present

## 2021-09-19 ENCOUNTER — Other Ambulatory Visit (HOSPITAL_BASED_OUTPATIENT_CLINIC_OR_DEPARTMENT_OTHER): Payer: Self-pay

## 2021-09-24 ENCOUNTER — Other Ambulatory Visit: Payer: Self-pay | Admitting: Allergy & Immunology

## 2021-09-26 ENCOUNTER — Other Ambulatory Visit: Payer: Self-pay | Admitting: *Deleted

## 2021-09-26 MED ORDER — FEXOFENADINE HCL 180 MG PO TABS: 180.0000 mg | ORAL_TABLET | Freq: Every day | ORAL | 1 refills | Status: DC

## 2021-09-28 ENCOUNTER — Other Ambulatory Visit (HOSPITAL_BASED_OUTPATIENT_CLINIC_OR_DEPARTMENT_OTHER): Payer: Self-pay

## 2021-09-28 ENCOUNTER — Encounter: Payer: Self-pay | Admitting: Family Medicine

## 2021-09-28 ENCOUNTER — Telehealth (INDEPENDENT_AMBULATORY_CARE_PROVIDER_SITE_OTHER): Payer: 59 | Admitting: Family Medicine

## 2021-09-28 VITALS — Ht 67.0 in | Wt 243.0 lb

## 2021-09-28 DIAGNOSIS — K5909 Other constipation: Secondary | ICD-10-CM | POA: Diagnosis not present

## 2021-09-28 DIAGNOSIS — K5904 Chronic idiopathic constipation: Secondary | ICD-10-CM | POA: Insufficient documentation

## 2021-09-28 MED ORDER — LINACLOTIDE 145 MCG PO CAPS
145.0000 ug | ORAL_CAPSULE | Freq: Every day | ORAL | 1 refills | Status: DC
  Filled 2021-09-28: qty 30, 30d supply, fill #0

## 2021-09-28 NOTE — Progress Notes (Signed)
Chief Complaint  Patient presents with   Constipation    X 2 months this go round. This is a chronic issue.     Subjective: Patient is a 45 y.o. female here for f/u. Due to COVID-19 pandemic, we are interacting via web portal for an electronic face-to-face visit. I verified patient's ID using 2 identifiers. Patient agreed to proceed with visit via this method. Patient is at home, I am at office. Patient and I are present for visit.   Hx of constipation. Has constipation over past 2 mo. Will normally go 1x/1-2 weeks. Sometimes will fill up commode. She resorts to using enemas and laxatives that helps to some extent. Keto diet helped with that. She has never used a rx medication. Stress levels are normal. Gets a decent amount water in. No N/V, still passing gas.   Past Medical History:  Diagnosis Date   Anxiety with depression 07/01/2017   Fibromyalgia    Vaginal Pap smear, abnormal     Objective: Ht 5\' 7"  (1.702 m)    Wt 243 lb (110.2 kg)    BMI 38.06 kg/m  No conversational dyspnea Age appropriate judgment and insight Nml affect and mood  Assessment and Plan: Chronic constipation - Plan: linaclotide (LINZESS) 145 MCG CAPS capsule  Chronic, uncontrolled. Daily fiber supp like Metamucil or Benefiber rec'd. Stay hydrated. Trial Linzess. If not covered or no improvement, will refer to GI. OK to cont Dulcolax and enemas prn.  The patient voiced understanding and agreement to the plan.  [place], DO 09/28/21  2:51 PM

## 2021-09-29 ENCOUNTER — Other Ambulatory Visit (HOSPITAL_BASED_OUTPATIENT_CLINIC_OR_DEPARTMENT_OTHER): Payer: Self-pay

## 2021-10-06 ENCOUNTER — Encounter: Payer: Self-pay | Admitting: Family Medicine

## 2021-10-11 ENCOUNTER — Other Ambulatory Visit: Payer: Self-pay | Admitting: Family Medicine

## 2021-10-11 DIAGNOSIS — Z720 Tobacco use: Secondary | ICD-10-CM

## 2021-10-11 MED ORDER — BUPROPION HCL ER (SMOKING DET) 150 MG PO TB12
ORAL_TABLET | ORAL | 2 refills | Status: DC
Start: 2021-10-11 — End: 2021-12-21

## 2021-10-13 ENCOUNTER — Encounter: Payer: 59 | Admitting: Family Medicine

## 2021-10-21 ENCOUNTER — Encounter: Payer: Self-pay | Admitting: Family Medicine

## 2021-10-21 DIAGNOSIS — K5909 Other constipation: Secondary | ICD-10-CM

## 2021-10-23 MED ORDER — LINACLOTIDE 145 MCG PO CAPS: 145.0000 ug | ORAL_CAPSULE | Freq: Every day | ORAL | 1 refills | Status: DC

## 2021-11-01 ENCOUNTER — Other Ambulatory Visit: Payer: Self-pay | Admitting: Family Medicine

## 2021-11-01 MED ORDER — TRULANCE 3 MG PO TABS: 3.0000 mg | ORAL_TABLET | Freq: Every day | ORAL | 2 refills | Status: DC

## 2021-11-06 ENCOUNTER — Telehealth: Payer: Self-pay | Admitting: Family Medicine

## 2021-11-06 NOTE — Telephone Encounter (Signed)
Called [employer] to inform that Trulance is replacing Linzess.  That was the reason for their call and this info was documented in the patients information with [employer].

## 2021-11-06 NOTE — Telephone Encounter (Signed)
[facility] contacted to verify med for pt.  Please contact [phone]

## 2021-11-21 NOTE — Telephone Encounter (Signed)
Opened in error

## 2021-12-07 DIAGNOSIS — R635 Abnormal weight gain: Secondary | ICD-10-CM | POA: Diagnosis not present

## 2021-12-11 ENCOUNTER — Encounter: Payer: BC Managed Care – PPO | Admitting: Family Medicine

## 2021-12-21 ENCOUNTER — Other Ambulatory Visit: Payer: Self-pay | Admitting: Family Medicine

## 2021-12-21 DIAGNOSIS — Z720 Tobacco use: Secondary | ICD-10-CM

## 2021-12-27 ENCOUNTER — Other Ambulatory Visit: Payer: Self-pay | Admitting: Family Medicine

## 2021-12-27 DIAGNOSIS — M797 Fibromyalgia: Secondary | ICD-10-CM

## 2022-01-19 ENCOUNTER — Ambulatory Visit: Payer: BC Managed Care – PPO | Admitting: Family Medicine

## 2022-01-26 ENCOUNTER — Ambulatory Visit (INDEPENDENT_AMBULATORY_CARE_PROVIDER_SITE_OTHER): Payer: BC Managed Care – PPO | Admitting: Family Medicine

## 2022-01-26 ENCOUNTER — Encounter: Payer: Self-pay | Admitting: Family Medicine

## 2022-01-26 VITALS — BP 120/80 | HR 89 | Temp 98.0°F | Ht 67.0 in | Wt 252.1 lb

## 2022-01-26 DIAGNOSIS — Z23 Encounter for immunization: Secondary | ICD-10-CM

## 2022-01-26 DIAGNOSIS — K5909 Other constipation: Secondary | ICD-10-CM | POA: Diagnosis not present

## 2022-01-26 DIAGNOSIS — Z0000 Encounter for general adult medical examination without abnormal findings: Secondary | ICD-10-CM

## 2022-01-26 DIAGNOSIS — E669 Obesity, unspecified: Secondary | ICD-10-CM

## 2022-01-26 LAB — COMPREHENSIVE METABOLIC PANEL
ALT: 14 U/L (ref 0–35)
AST: 16 U/L (ref 0–37)
Albumin: 4.1 g/dL (ref 3.5–5.2)
Alkaline Phosphatase: 59 U/L (ref 39–117)
BUN: 16 mg/dL (ref 6–23)
CO2: 25 mEq/L (ref 19–32)
Calcium: 8.7 mg/dL (ref 8.4–10.5)
Chloride: 109 mEq/L (ref 96–112)
Creatinine, Ser: 1.18 mg/dL (ref 0.40–1.20)
GFR: 56.6 mL/min — ABNORMAL LOW (ref >60.00)
Glucose, Bld: 94 mg/dL (ref 70–99)
Potassium: 4.2 mEq/L (ref 3.5–5.1)
Sodium: 139 mEq/L (ref 135–145)
Total Bilirubin: 0.3 mg/dL (ref 0.2–1.2)
Total Protein: 7.1 g/dL (ref 6.0–8.3)

## 2022-01-26 LAB — LIPID PANEL
Cholesterol: 175 mg/dL (ref 0–200)
HDL: 49.9 mg/dL (ref >39.00)
LDL Cholesterol: 113 mg/dL — ABNORMAL HIGH (ref 0–99)
NonHDL: 124.71
Total CHOL/HDL Ratio: 3
Triglycerides: 58 mg/dL (ref 0.0–149.0)
VLDL: 11.6 mg/dL (ref 0.0–40.0)

## 2022-01-26 LAB — CBC
HCT: 39.5 % (ref 36.0–46.0)
Hemoglobin: 13 g/dL (ref 12.0–15.0)
MCHC: 32.9 g/dL (ref 30.0–36.0)
MCV: 88.6 fl (ref 78.0–100.0)
Platelets: 314 10*3/uL (ref 150.0–400.0)
RBC: 4.46 Mil/uL (ref 3.87–5.11)
RDW: 14.2 % (ref 11.5–15.5)
WBC: 4.1 10*3/uL (ref 4.0–10.5)

## 2022-01-26 MED ORDER — SEMAGLUTIDE-WEIGHT MANAGEMENT 1.7 MG/0.75ML ~~LOC~~ SOAJ: 1.7000 mg | SUBCUTANEOUS | 0 refills | Status: DC

## 2022-01-26 MED ORDER — LUBIPROSTONE 24 MCG PO CAPS: 24.0000 ug | ORAL_CAPSULE | Freq: Two times a day (BID) | ORAL | 2 refills | Status: DC

## 2022-01-26 MED ORDER — SEMAGLUTIDE-WEIGHT MANAGEMENT 1 MG/0.5ML ~~LOC~~ SOAJ: 1.0000 mg | SUBCUTANEOUS | 0 refills | Status: DC

## 2022-01-26 MED ORDER — SEMAGLUTIDE-WEIGHT MANAGEMENT 2.4 MG/0.75ML ~~LOC~~ SOAJ: 2.4000 mg | SUBCUTANEOUS | 0 refills | Status: DC

## 2022-01-26 MED ORDER — SEMAGLUTIDE-WEIGHT MANAGEMENT 0.25 MG/0.5ML ~~LOC~~ SOAJ: 0.2500 mg | SUBCUTANEOUS | 0 refills | Status: DC

## 2022-01-26 MED ORDER — SEMAGLUTIDE-WEIGHT MANAGEMENT 0.5 MG/0.5ML ~~LOC~~ SOAJ: 0.5000 mg | SUBCUTANEOUS | 0 refills | Status: DC

## 2022-01-26 NOTE — Progress Notes (Signed)
Chief Complaint  ?Patient presents with  ? Annual Exam  ?  Medication for constipation not working well.  ?  ? ?Well Woman ?Kerri Guerra is here for a complete physical.   ?Her last physical was >1 year ago.  ?Current diet: in general, a "healthy" diet. ?Current exercise: Wt resistance exercise, cardio. ?Weight is fluctuating and she denies fatigue out of ordinary. ?Seatbelt? Yes ?Advanced directive? No ? ?Health Maintenance ?Pap/HPV- Yes ?Mammogram- Yes ?Tetanus- No ?Hep C screening- Yes ?HIV screening- Yes ? ?Obesity ?The patient is been struggling with her weight.  Diet and exercise as above.  She is interested in something that could help her lose weight.  She is not following with a dietitian or medical weight loss physician. ? ?Constipation ?The patient was doing very well on Linzess when her insurance company changed and it stopped covering it.  She was prescribed Trulance which is quite expensive and not nearly as helpful.  She continues to have around 1 or 2 bowel movements weekly.  She has some pain but no bleeding. ? ?Past Medical History:  ?Diagnosis Date  ? Anxiety with depression 07/01/2017  ? Fibromyalgia   ? Vaginal Pap smear, abnormal   ?  ? ?Past Surgical History:  ?Procedure Laterality Date  ? COLPOSCOPY    ? ? ?Medications  ?Current Outpatient Medications on File Prior to Visit  ?Medication Sig Dispense Refill  ? buPROPion (ZYBAN) 150 MG 12 hr tablet Start 1 week before quit date and take 1 tablet by mouth daily for 3 days, then take 1 tablet by mouth twice daily. 60 tablet 0  ? busPIRone (BUSPAR) 15 MG tablet Take 1 tablet (15 mg total) by mouth 2 (two) times daily. 180 tablet 2  ? clotrimazole (LOTRIMIN) 1 % cream Apply 1 application topically 2 (two) times daily. 30 g 0  ? DRYSOL 20 % external solution Apply topically at bedtime. 60 mL 0  ? DULoxetine (CYMBALTA) 60 MG capsule Take 1 capsule by mouth daily. 90 capsule 2  ? fexofenadine (ALLEGRA) 180 MG tablet Take 1 tablet (180 mg total) by  mouth daily. 90 tablet 1  ? ibuprofen (ADVIL,MOTRIN) 400 MG tablet Take 400 mg by mouth every 6 (six) hours as needed.     ? influenza vac split quadrivalent PF (FLUARIX) 0.5 ML injection Inject into the muscle. 0.5 mL 0  ? montelukast (SINGULAIR) 10 MG tablet Take 1 tablet by mouth daily. 90 tablet 1  ? Plecanatide (TRULANCE) 3 MG TABS Take 3 mg by mouth daily. 90 tablet 2  ? topiramate (TOPAMAX) 25 MG tablet Take 1 tablet by mouth 2 (two) times daily.    ? traZODone (DESYREL) 50 MG tablet Take 1/2 to 1 tablet by mouth at bedtime as needed for sleep. 30 tablet 11  ? triamcinolone ointment (KENALOG) 0.1 % Apply in the ear canal with a q-tip twice daily as directed. 80 g 0  ? ?Allergies ?No Known Allergies ? ?Review of Systems: ?Constitutional:  no unexpected weight changes ?Eye:  no recent significant change in vision ?Ear/Nose/Mouth/Throat:  Ears:  no recent change in hearing ?Nose/Mouth/Throat:  no complaints of nasal congestion, no sore throat ?Cardiovascular: no chest pain ?Respiratory:  no shortness of breath ?Gastrointestinal:  + Constipation ?GU:  Female: negative for dysuria or pelvic pain ?Musculoskeletal/Extremities:  no new pain of the joints ?Integumentary (Skin/Breast):  no abnormal skin lesions reported ?Neurologic:  no headaches ?Endocrine:  denies fatigue ?Hematologic/Lymphatic:  No areas of easy bleeding ? ?Exam ?BP  120/80   Pulse 89   Temp 98 ?F (36.7 ?C) (Oral)   Ht 5\' 7"  (1.702 m)   Wt 252 lb 2 oz (114.4 kg)   SpO2 99%   BMI 39.49 kg/m?  ?General:  well developed, well nourished, in no apparent distress ?Skin:  no significant moles, warts, or growths ?Head:  no masses, lesions, or tenderness ?Eyes:  pupils equal and round, sclera anicteric without injection ?Ears:  canals without lesions, TMs shiny without retraction, no obvious effusion, no erythema ?Nose:  nares patent, septum midline, mucosa normal, and no drainage or sinus tenderness ?Throat/Pharynx:  lips and gingiva without lesion;  tongue and uvula midline; non-inflamed pharynx; no exudates or postnasal drainage ?Neck: neck supple without adenopathy, thyromegaly, or masses ?Lungs:  clear to auscultation, breath sounds equal bilaterally, no respiratory distress ?Cardio:  regular rate and rhythm, no LE edema ?Abdomen:  abdomen soft, nontender; bowel sounds normal; no masses or organomegaly ?Genital: Defer to GYN ?Musculoskeletal:  symmetrical muscle groups noted without atrophy or deformity ?Extremities:  no clubbing, cyanosis, or edema, no deformities, no skin discoloration ?Neuro:  gait normal; deep tendon reflexes normal and symmetric ?Psych: well oriented with normal range of affect and appropriate judgment/insight ? ?Assessment and Plan ? ?Well adult exam - Plan: CBC, Comprehensive metabolic panel, Lipid panel ? ?Obesity (BMI 30-39.9) - Plan: Semaglutide-Weight Management 0.25 MG/0.5ML SOAJ, Semaglutide-Weight Management 0.5 MG/0.5ML SOAJ, Semaglutide-Weight Management 1 MG/0.5ML SOAJ, Semaglutide-Weight Management 1.7 MG/0.75ML SOAJ, Semaglutide-Weight Management 2.4 MG/0.75ML SOAJ ? ?Chronic constipation - Plan: lubiprostone (AMITIZA) 24 MCG capsule ? ?Need for Tdap vaccination - Plan: Tdap vaccine greater than or equal to 7yo IM  ? ?Well 46 y.o. female. ?Counseled on diet and exercise. ?Other orders as above. ?Obesity-chronic, not controlled.  Trial Wegovy.  Do not fill up too expensive.  Recheck in 6 weeks if she is able to get the medication.  We discussed the side effects she may experience. ?Constipation-chronic, not controlled.  Stop Trulance, see if they will cover Amitiza.  If not covered, will refer to gastroenterology for their help.  It would be great if we can get her back on Linzess which we know works for her. ?Advanced directive form provided today.  ?Follow up in 6 months or prn. ?The patient voiced understanding and agreement to the plan. ? ?55, DO ?01/26/22 ?4:37 PM ? ?

## 2022-01-26 NOTE — Patient Instructions (Signed)
Give us 2-3 business days to get the results of your labs back.   Keep the diet clean and stay active.  Please get me a copy of your advanced directive form at your convenience.   Let us know if you need anything.  

## 2022-01-29 ENCOUNTER — Telehealth: Payer: Self-pay | Admitting: Family Medicine

## 2022-01-29 NOTE — Telephone Encounter (Signed)
Initiated PA for [employer] on Covermymeds. ?VWP:[id number] ?Waiting response. ?

## 2022-01-30 MED ORDER — TOPIRAMATE 25 MG PO TABS: 25.0000 mg | ORAL_TABLET | Freq: Two times a day (BID) | ORAL | 1 refills | Status: DC

## 2022-01-30 NOTE — Telephone Encounter (Signed)
Called left message to call back 

## 2022-01-30 NOTE — Telephone Encounter (Signed)
Received Denial from [employer] for [facility] ?This drug is to treat weight loss and which is not covered under her plan. ?Please offer alternative. ?

## 2022-01-30 NOTE — Telephone Encounter (Signed)
She states that her previous provider had her topiramate she thinks for this reason as her insurance will not pay for weight loss medications ?Unless PCP has anything better to provide she is ok to have the topiramate refilled at this time. ?She said it has decreased her appetite some. ?

## 2022-01-31 ENCOUNTER — Telehealth: Payer: Self-pay | Admitting: Family Medicine

## 2022-01-31 NOTE — Telephone Encounter (Signed)
Initiate PA for Lubiprostone 24 mcg ?Received message PA was resolved no PA necessary ?

## 2022-01-31 NOTE — Telephone Encounter (Signed)
f °

## 2022-02-04 ENCOUNTER — Other Ambulatory Visit: Payer: Self-pay | Admitting: Family Medicine

## 2022-02-04 DIAGNOSIS — Z720 Tobacco use: Secondary | ICD-10-CM

## 2022-02-12 ENCOUNTER — Other Ambulatory Visit: Payer: Self-pay | Admitting: Family Medicine

## 2022-02-12 ENCOUNTER — Encounter: Payer: Self-pay | Admitting: Family Medicine

## 2022-02-12 MED ORDER — TRULANCE 3 MG PO TABS: 1.0000 | ORAL_TABLET | Freq: Every day | ORAL | 5 refills | Status: DC

## 2022-02-13 ENCOUNTER — Telehealth: Payer: Self-pay | Admitting: Family Medicine

## 2022-02-13 NOTE — Telephone Encounter (Signed)
Pharmacy informed.

## 2022-02-13 NOTE — Telephone Encounter (Signed)
Pharmacy would like to know if Plecanatide (TRULANCE) 3 MG TABS is replacing lubiprostone. They can be reached at [phone]. Please advise.  ?

## 2022-02-25 DIAGNOSIS — J029 Acute pharyngitis, unspecified: Secondary | ICD-10-CM | POA: Diagnosis not present

## 2022-02-28 DIAGNOSIS — Z6839 Body mass index (BMI) 39.0-39.9, adult: Secondary | ICD-10-CM | POA: Diagnosis not present

## 2022-02-28 DIAGNOSIS — J069 Acute upper respiratory infection, unspecified: Secondary | ICD-10-CM | POA: Diagnosis not present

## 2022-02-28 DIAGNOSIS — Z789 Other specified health status: Secondary | ICD-10-CM | POA: Diagnosis not present

## 2022-03-12 ENCOUNTER — Encounter: Payer: Self-pay | Admitting: Family Medicine

## 2022-03-22 ENCOUNTER — Other Ambulatory Visit: Payer: Self-pay | Admitting: Family Medicine

## 2022-03-22 DIAGNOSIS — F418 Other specified anxiety disorders: Secondary | ICD-10-CM

## 2022-03-28 ENCOUNTER — Other Ambulatory Visit: Payer: Self-pay | Admitting: Family Medicine

## 2022-03-28 DIAGNOSIS — Z720 Tobacco use: Secondary | ICD-10-CM

## 2022-04-11 DIAGNOSIS — Z0289 Encounter for other administrative examinations: Secondary | ICD-10-CM

## 2022-04-19 ENCOUNTER — Encounter: Payer: Self-pay | Admitting: Family Medicine

## 2022-04-19 ENCOUNTER — Other Ambulatory Visit: Payer: Self-pay | Admitting: Family Medicine

## 2022-04-19 DIAGNOSIS — F418 Other specified anxiety disorders: Secondary | ICD-10-CM

## 2022-04-20 ENCOUNTER — Other Ambulatory Visit (HOSPITAL_BASED_OUTPATIENT_CLINIC_OR_DEPARTMENT_OTHER): Payer: Self-pay

## 2022-04-20 ENCOUNTER — Other Ambulatory Visit: Payer: Self-pay | Admitting: Family Medicine

## 2022-04-20 DIAGNOSIS — K5909 Other constipation: Secondary | ICD-10-CM

## 2022-04-20 MED ORDER — LINACLOTIDE 145 MCG PO CAPS
145.0000 ug | ORAL_CAPSULE | Freq: Every day | ORAL | 1 refills | Status: DC
  Filled 2022-04-20 – 2022-05-09 (×2): qty 30, 30d supply, fill #0

## 2022-04-22 ENCOUNTER — Other Ambulatory Visit: Payer: Self-pay | Admitting: Allergy & Immunology

## 2022-04-27 ENCOUNTER — Telehealth: Payer: Self-pay | Admitting: Family Medicine

## 2022-04-27 MED ORDER — LACTULOSE 10 G PO PACK: 10.0000 g | PACK | Freq: Every day | ORAL | 2 refills | Status: DC

## 2022-04-27 NOTE — Telephone Encounter (Signed)
Received Denial from [employer] for Linzess. Patient does not meet program criteria. Must have tried and had a poor response to two formulary alternative drugs.  Lactulose solution and Trulance are some of the formulary alternatives.

## 2022-04-27 NOTE — Telephone Encounter (Signed)
PCP completed Coverage Exception for LInzess Faxed to [facility] Dept. [phone] Received fax confirmation.

## 2022-04-27 NOTE — Telephone Encounter (Signed)
She has failed Trulance, will send in Lactulose. Ty.

## 2022-04-27 NOTE — Addendum Note (Signed)
Addended by: Radene Gunning on: 04/27/2022 04:51 PM   Modules accepted: Orders

## 2022-04-30 NOTE — Telephone Encounter (Signed)
Called left message to call back 

## 2022-04-30 NOTE — Telephone Encounter (Signed)
Patient informed of results/medication sent in.

## 2022-05-08 ENCOUNTER — Other Ambulatory Visit: Payer: Self-pay | Admitting: Family Medicine

## 2022-05-08 DIAGNOSIS — L74510 Primary focal hyperhidrosis, axilla: Secondary | ICD-10-CM

## 2022-05-08 MED ORDER — DRYSOL 20 % EX SOLN: CUTANEOUS | 0 refills | Status: DC

## 2022-05-09 ENCOUNTER — Other Ambulatory Visit (HOSPITAL_BASED_OUTPATIENT_CLINIC_OR_DEPARTMENT_OTHER): Payer: Self-pay

## 2022-05-11 DIAGNOSIS — F329 Major depressive disorder, single episode, unspecified: Secondary | ICD-10-CM | POA: Diagnosis not present

## 2022-05-11 DIAGNOSIS — F411 Generalized anxiety disorder: Secondary | ICD-10-CM | POA: Diagnosis not present

## 2022-05-15 ENCOUNTER — Ambulatory Visit (INDEPENDENT_AMBULATORY_CARE_PROVIDER_SITE_OTHER): Payer: BC Managed Care – PPO | Admitting: Family Medicine

## 2022-05-15 ENCOUNTER — Encounter (INDEPENDENT_AMBULATORY_CARE_PROVIDER_SITE_OTHER): Payer: Self-pay | Admitting: Family Medicine

## 2022-05-15 VITALS — BP 109/77 | HR 85 | Temp 98.1°F | Ht 68.0 in | Wt 240.0 lb

## 2022-05-15 DIAGNOSIS — R0602 Shortness of breath: Secondary | ICD-10-CM | POA: Diagnosis not present

## 2022-05-15 DIAGNOSIS — M797 Fibromyalgia: Secondary | ICD-10-CM

## 2022-05-15 DIAGNOSIS — R5383 Other fatigue: Secondary | ICD-10-CM

## 2022-05-15 DIAGNOSIS — F5089 Other specified eating disorder: Secondary | ICD-10-CM

## 2022-05-15 DIAGNOSIS — Z6836 Body mass index (BMI) 36.0-36.9, adult: Secondary | ICD-10-CM | POA: Diagnosis not present

## 2022-05-15 DIAGNOSIS — E66812 Obesity, class 2: Secondary | ICD-10-CM

## 2022-05-15 DIAGNOSIS — F3289 Other specified depressive episodes: Secondary | ICD-10-CM | POA: Diagnosis not present

## 2022-05-15 DIAGNOSIS — E669 Obesity, unspecified: Secondary | ICD-10-CM | POA: Diagnosis not present

## 2022-05-15 MED ORDER — BUPROPION HCL ER (SR) 150 MG PO TB12: 150.0000 mg | ORAL_TABLET | Freq: Two times a day (BID) | ORAL | 0 refills | Status: DC

## 2022-05-17 DIAGNOSIS — F329 Major depressive disorder, single episode, unspecified: Secondary | ICD-10-CM | POA: Diagnosis not present

## 2022-05-17 DIAGNOSIS — F411 Generalized anxiety disorder: Secondary | ICD-10-CM | POA: Diagnosis not present

## 2022-05-21 LAB — LIPID PANEL
Chol/HDL Ratio: 3.5 ratio (ref 0.0–4.4)
Cholesterol, Total: 148 mg/dL (ref 100–199)
HDL: 42 mg/dL (ref >39)
LDL Chol Calc (NIH): 91 mg/dL (ref 0–99)
Triglycerides: 77 mg/dL (ref 0–149)
VLDL Cholesterol Cal: 15 mg/dL (ref 5–40)

## 2022-05-21 LAB — HEMOGLOBIN A1C
Est. average glucose Bld gHb Est-mCnc: 114 mg/dL
Hgb A1c MFr Bld: 5.6 % (ref 4.8–5.6)

## 2022-05-21 LAB — FERRITIN: Ferritin: 80 ng/mL (ref 15–150)

## 2022-05-21 LAB — TSH RFX ON ABNORMAL TO FREE T4: TSH: 2.67 u[IU]/mL (ref 0.450–4.500)

## 2022-05-21 LAB — VITAMIN D, 25-HYDROXY, TOTAL: Vitamin D, 25-Hydroxy, Serum: 32 ng/mL

## 2022-05-21 LAB — INSULIN, RANDOM: INSULIN: 14.3 u[IU]/mL (ref 2.6–24.9)

## 2022-05-21 LAB — VITAMIN B12: Vitamin B-12: 1207 pg/mL (ref 232–1245)

## 2022-05-22 ENCOUNTER — Other Ambulatory Visit: Payer: Self-pay | Admitting: *Deleted

## 2022-05-22 MED ORDER — MONTELUKAST SODIUM 10 MG PO TABS: 10.0000 mg | ORAL_TABLET | Freq: Every day | ORAL | 1 refills | Status: DC

## 2022-05-22 NOTE — Progress Notes (Unsigned)
Chief Complaint:   OBESITY Kerri Guerra (MR# [id number]) is a 46 y.o. female who presents for evaluation and treatment of obesity and related comorbidities. Current BMI is Body mass index is 36.49 kg/m. Kerri Guerra has been struggling with her weight for many years and has been unsuccessful in either losing weight, maintaining weight loss, or reaching her healthy weight goal.  Kerri Guerra is currently in the action stage of change and ready to dedicate time achieving and maintaining a healthier weight. Kerri Guerra is interested in becoming our patient and working on intensive lifestyle modifications including (but not limited to) diet and exercise for weight loss.  Kerri Guerra has been overweight since childhood, but weight was more recently a problem with low energy and fibromyalgia. She gained 20 lbs during the pandemic and lost approximately 20 lbs on Keto diet. Kerri Guerra is going through a separation and eats out a lot. She is taking topiramate and Wellbutrin through Dr. Sofie Rower and reports they have helped. Kerri Guerra was going to [employer] in the past. She aims for 100 grams of protein daily. She has lost 20 lbs in the last 6 months.  [name]'s habits were reviewed today and are as follows: her desired weight loss is 60 lbs, she has been heavy most of her life, she started gaining weight during the pandemic, her heaviest weight ever was 267 pounds, she has significant food cravings issues, she snacks frequently in the evenings, she wakes up frequently in the middle of the night to eat, she skips meals frequently, she is frequently drinking liquids with calories, she frequently makes poor food choices, she frequently eats larger portions than normal, she has binge eating behaviors, and she struggles with emotional eating.  Depression Screen Kerri Guerra's Food and Mood (modified PHQ-9) score was 15.     05/15/2022    7:23 AM  Depression screen PHQ 2/9  Decreased Interest 2  Down, Depressed, Hopeless 1  PHQ  - 2 Score 3  Altered sleeping 2  Tired, decreased energy 3  Change in appetite 2  Feeling bad or failure about yourself  2  Trouble concentrating 0  Moving slowly or fidgety/restless 0  Suicidal thoughts 0  PHQ-9 Score 12  Difficult doing work/chores Extremely dIfficult   Subjective:   1. Other fatigue Odeal admits to daytime somnolence and admits to waking up still tired. Patient has a history of symptoms of daytime fatigue, morning fatigue, and morning headache. Kerri Guerra generally gets  7-12  hours of sleep per night, and states that she has poor sleep quality. Snoring is present. Apneic episodes are present. Epworth Sleepiness Score is 8. Kerri Guerra's expected BMR 1949. Her actual BMR 1843 and less than expected. This can be due to depressed mood, lack of physical activity, and/or work stress.  2. SOB (shortness of breath) Kerri Guerra notes increasing shortness of breath with exercising and seems to be worsening over time with weight gain. She notes getting out of breath sooner with activity than she used to. This has gotten worse recently. Kerri Guerra denies shortness of breath at rest or orthopnea.  3. Emotional eating PHQ9= 15. Kerri Guerra is on Bupropion, Cymbalta, and Buspar.   4. Fibromyalgia Kerri Guerra is able to do some exercise without restriction. She is on Cymbalta for pain and getting adequate sleep.  5. Other depression She is prescribed Wellbutrin SR 150 mg BID, which has helped. Kerri Guerra notes increased stressors have had an affect on her mood. We dicussed job changes.  Assessment/Plan:   1. Other fatigue  Bret does feel that her weight is causing her energy to be lower than it should be. Fatigue may be related to obesity, depression or many other causes. Labs will be ordered, and in the meanwhile, Kerri Guerra will focus on self care including making healthy food choices, increasing physical activity and focusing on stress reduction.  Lab/Orders today: - EKG 12-Lead - Lipid panel - Vitamin B12 -  Vitamin D, 25-Hydroxy, Total - TSH Rfx on Abnormal to Free T4 - Hemoglobin A1c - Insulin, random - Ferritin  2. SOB (shortness of breath) Layah does feel that she gets out of breath more easily that she used to when she exercises. Kerri Guerra's shortness of breath appears to be obesity related and exercise induced. She has agreed to work on weight loss and gradually increase exercise to treat her exercise induced shortness of breath. Will continue to monitor closely.  3. Emotional eating Continue current meds, practice mindful eating, and consider CBT with Dr. Dewaine Conger.  4. Fibromyalgia Continue current treatment plan, and aim to slowly increase regular exercise.  5. Other depression Behavior modification techniques were discussed today to help Kerri Guerra deal with her emotional/non-hunger eating behaviors.  Orders and follow up as documented in patient record.   Refill- buPROPion (WELLBUTRIN SR) 150 MG 12 hr tablet; Take 1 tablet (150 mg total) by mouth 2 (two) times daily.  Dispense: 60 tablet; Refill: 0  6. Obesity, current BMI 36.5 Kerri Guerra is currently in the action stage of change and her goal is to continue with weight loss efforts. I recommend [name] begin the structured treatment plan as follows:  She has agreed to the Category 2 Plan and keeping a food journal and adhering to recommended goals of 1200-1400 calories and 100 grams protein.  Kerri Guerra is on Topamax 25 mg BID (last Rx through PCP).  Exercise goals:  Plan gym workouts more than once a week.    Behavioral modification strategies: increasing lean protein intake, increasing water intake, decreasing eating out, no skipping meals, keeping healthy foods in the home, better snacking choices, and decreasing junk food.  She was informed of the importance of frequent follow-up visits to maximize her success with intensive lifestyle modifications for her multiple health conditions. She was informed we would discuss her lab results at  her next visit unless there is a critical issue that needs to be addressed sooner. Kerri Guerra agreed to keep her next visit at the agreed upon time to discuss these results.  Objective:   Blood pressure 109/77, pulse 85, temperature 98.1 F (36.7 C), height 5\' 8"  (1.727 m), weight 240 lb (108.9 kg), SpO2 98 %. Body mass index is 36.49 kg/m.  EKG: Normal sinus rhythm, rate 84.  Indirect Calorimeter completed today shows a VO2 of 267 and a REE of 1843.  Her calculated basal metabolic rate is thus her basal metabolic rate is worse than expected.  General: Cooperative, alert, well developed, in no acute distress. HEENT: Conjunctivae and lids unremarkable. Cardiovascular: Regular rhythm.  Lungs: Normal work of breathing. Neurologic: No focal deficits.   Lab Results  Component Value Date   CREATININE 1.18 01/26/2022   BUN 16 01/26/2022   NA 139 01/26/2022   K 4.2 01/26/2022   CL 109 01/26/2022   CO2 25 01/26/2022   Lab Results  Component Value Date   ALT 14 01/26/2022   AST 16 01/26/2022   ALKPHOS 59 01/26/2022   BILITOT 0.3 01/26/2022   Lab Results  Component Value Date   HGBA1C 5.6 05/15/2022  Lab Results  Component Value Date   INSULIN 14.3 05/15/2022   Lab Results  Component Value Date   TSH 2.670 05/15/2022   Lab Results  Component Value Date   CHOL 148 05/15/2022   HDL 42 05/15/2022   LDLCALC 91 05/15/2022   TRIG 77 05/15/2022   CHOLHDL 3.5 05/15/2022   Lab Results  Component Value Date   WBC 4.1 01/26/2022   HGB 13.0 01/26/2022   HCT 39.5 01/26/2022   MCV 88.6 01/26/2022   PLT 314.0 01/26/2022   Lab Results  Component Value Date   FERRITIN 80 05/15/2022   Attestation Statements:   Reviewed by clinician on day of visit: allergies, medications, problem list, medical history, surgical history, family history, social history, and previous encounter notes.  I, Kyung Rudd, BS, CMA, am acting as transcriptionist for Seymour Bars, DO.  I have  reviewed the above documentation for accuracy and completeness, and I agree with the above. Glennis Brink, DO

## 2022-05-29 ENCOUNTER — Ambulatory Visit (INDEPENDENT_AMBULATORY_CARE_PROVIDER_SITE_OTHER): Payer: BC Managed Care – PPO | Admitting: Bariatrics

## 2022-05-29 ENCOUNTER — Encounter (INDEPENDENT_AMBULATORY_CARE_PROVIDER_SITE_OTHER): Payer: Self-pay | Admitting: Bariatrics

## 2022-05-29 VITALS — BP 124/86 | HR 101 | Temp 97.4°F | Ht 68.0 in | Wt 234.0 lb

## 2022-05-29 DIAGNOSIS — E8881 Metabolic syndrome: Secondary | ICD-10-CM | POA: Diagnosis not present

## 2022-05-29 DIAGNOSIS — E559 Vitamin D deficiency, unspecified: Secondary | ICD-10-CM | POA: Insufficient documentation

## 2022-05-29 DIAGNOSIS — K5909 Other constipation: Secondary | ICD-10-CM | POA: Diagnosis not present

## 2022-05-29 DIAGNOSIS — E669 Obesity, unspecified: Secondary | ICD-10-CM

## 2022-05-29 DIAGNOSIS — Z6835 Body mass index (BMI) 35.0-35.9, adult: Secondary | ICD-10-CM

## 2022-05-29 DIAGNOSIS — E88819 Insulin resistance, unspecified: Secondary | ICD-10-CM | POA: Insufficient documentation

## 2022-05-30 ENCOUNTER — Encounter (INDEPENDENT_AMBULATORY_CARE_PROVIDER_SITE_OTHER): Payer: Self-pay | Admitting: Bariatrics

## 2022-05-30 NOTE — Progress Notes (Signed)
Chief Complaint:   OBESITY Kerri Guerra is here to discuss her progress with her obesity treatment plan along with follow-up of her obesity related diagnoses. Kerri Guerra is on the Category 2 Plan and keeping a food journal and adhering to recommended goals of 1200-1400 calories and 100 protein and states she is following her eating plan approximately 65% of the time. Kerri Guerra states she is going to the gym for 45 minutes 1 times per week.  Today's visit was #: 2 Starting weight: 240 lbs Starting date: 05/15/2022 Today's weight: 234 lbs Today's date: 05/29/2022 Total lbs lost to date: 6 Total lbs lost since last in-office visit: 6  Interim History: Kerri Guerra is down 6 lbs since her first visit. She has been skipping meals.  Subjective:   1. Insulin resistance Kerri Guerra is not taking any medications currently. Last Insulin was 14.3 and A1c was 5.6.  2. Chronic constipation Kerri Guerra is taking Lactulose and Linzess.   3. Vitamin D insufficiency Kerri Guerra is taking Multivitamins.   Assessment/Plan:   1. Insulin resistance Handouts on prediabetes and insulin resistance were given to the patient today. Dining out guide was given, and Kerri Guerra will work on decreasing carbohydrates.   2. Chronic constipation Kerri Guerra will continue her medications as directed.  3. Vitamin D insufficiency Kerri Guerra will continue Multivitamins and will add OTC Vitamin D 2,000 IU daily.  4. Obesity, current BMI 35.7 Kerri Guerra is currently in the action stage of change. As such, her goal is to continue with weight loss efforts. She has agreed to the Category 2 Plan and keeping a food journal and adhering to recommended goals of 1200 calories and 80-120 grams of protein.   Meal planning was dicussed. Reviewed labs with the patient from 01/26/2022, CMP, Lipids, CBC, A1C, Insulin, TSH. She will continue journaling and will increase her protein.  Exercise goals: As is, increase to two times per week.  Behavioral  modification strategies: increasing lean protein intake, decreasing simple carbohydrates, increasing vegetables, increasing water intake, decreasing eating out, no skipping meals, meal planning and cooking strategies, keeping healthy foods in the home, and planning for success.  Kerri Guerra has agreed to follow-up with our clinic in 2 weeks with Dr. Cathey Endow. She was informed of the importance of frequent follow-up visits to maximize her success with intensive lifestyle modifications for her multiple health conditions.   Objective:   Blood pressure 124/86, pulse (!) 101, temperature (!) 97.4 F (36.3 C), height 5\' 8"  (1.727 m), weight 234 lb (106.1 kg), SpO2 99 %. Body mass index is 35.58 kg/m.  General: Cooperative, alert, well developed, in no acute distress. HEENT: Conjunctivae and lids unremarkable. Cardiovascular: Regular rhythm.  Lungs: Normal work of breathing. Neurologic: No focal deficits.   Lab Results  Component Value Date   CREATININE 1.18 01/26/2022   BUN 16 01/26/2022   NA 139 01/26/2022   K 4.2 01/26/2022   CL 109 01/26/2022   CO2 25 01/26/2022   Lab Results  Component Value Date   ALT 14 01/26/2022   AST 16 01/26/2022   ALKPHOS 59 01/26/2022   BILITOT 0.3 01/26/2022   Lab Results  Component Value Date   HGBA1C 5.6 05/15/2022   Lab Results  Component Value Date   INSULIN 14.3 05/15/2022   Lab Results  Component Value Date   TSH 2.670 05/15/2022   Lab Results  Component Value Date   CHOL 148 05/15/2022   HDL 42 05/15/2022   LDLCALC 91 05/15/2022   TRIG 77 05/15/2022  CHOLHDL 3.5 05/15/2022   Lab Results  Component Value Date   VD25OH 37.88 05/15/2018   VD25OH 21.89 (L) 02/12/2018   VD25OH 22 (L) 11/15/2017   Lab Results  Component Value Date   WBC 4.1 01/26/2022   HGB 13.0 01/26/2022   HCT 39.5 01/26/2022   MCV 88.6 01/26/2022   PLT 314.0 01/26/2022   Lab Results  Component Value Date   FERRITIN 43 05/15/2022    Attestation Statements:    Reviewed by clinician on day of visit: allergies, medications, problem list, medical history, surgical history, family history, social history, and previous encounter notes.   Micheline Rough, am acting as Energy manager for [employer], DO.  I have reviewed the above documentation for accuracy and completeness, and I agree with the above. Corinna Capra, DO

## 2022-06-01 DIAGNOSIS — F411 Generalized anxiety disorder: Secondary | ICD-10-CM | POA: Diagnosis not present

## 2022-06-01 DIAGNOSIS — F329 Major depressive disorder, single episode, unspecified: Secondary | ICD-10-CM | POA: Diagnosis not present

## 2022-06-08 DIAGNOSIS — F411 Generalized anxiety disorder: Secondary | ICD-10-CM | POA: Diagnosis not present

## 2022-06-08 DIAGNOSIS — F329 Major depressive disorder, single episode, unspecified: Secondary | ICD-10-CM | POA: Diagnosis not present

## 2022-06-14 ENCOUNTER — Encounter (INDEPENDENT_AMBULATORY_CARE_PROVIDER_SITE_OTHER): Payer: Self-pay

## 2022-06-15 ENCOUNTER — Telehealth (INDEPENDENT_AMBULATORY_CARE_PROVIDER_SITE_OTHER): Payer: BC Managed Care – PPO | Admitting: Family Medicine

## 2022-06-15 ENCOUNTER — Encounter: Payer: Self-pay | Admitting: Family Medicine

## 2022-06-15 DIAGNOSIS — J309 Allergic rhinitis, unspecified: Secondary | ICD-10-CM

## 2022-06-15 DIAGNOSIS — F418 Other specified anxiety disorders: Secondary | ICD-10-CM

## 2022-06-15 DIAGNOSIS — F329 Major depressive disorder, single episode, unspecified: Secondary | ICD-10-CM | POA: Diagnosis not present

## 2022-06-15 DIAGNOSIS — F411 Generalized anxiety disorder: Secondary | ICD-10-CM | POA: Diagnosis not present

## 2022-06-15 MED ORDER — PREDNISONE 20 MG PO TABS: 40.0000 mg | ORAL_TABLET | Freq: Every day | ORAL | 0 refills | Status: AC

## 2022-06-15 NOTE — Progress Notes (Addendum)
Chief Complaint  Patient presents with   Cough    Congestion Fatigue No fever Has been sick for 3 days.    Subjective Kerri Guerra presents for f/u anxiety/depression. Due to COVID-19 pandemic, we are interacting via web portal for an electronic face-to-face visit. I verified patient's ID using 2 identifiers. Patient agreed to proceed with visit via this method. Patient is at home, I am at office. Patient and I are present for visit.   Pt is currently being treated with trazodone 25-50 mg qhs prn, Cymbalta 60 mg/d, BuSpar 15 mg bid, Wellbutrin 150 mg bid.  Reports struggling w depression over the past few months and getting worse.  Work is a strong stressor for her.  No thoughts of harming self or others. No self-medication with prescription drugs or illicit drugs. Pt is following with a counselor/psychologist.  URI Duration: 4 days  Associated symptoms: sinus congestion and itchy eyes/throat/ears. Sneezing.  Denies: sinus pain, ear pain, ear drainage, sore throat, wheezing, shortness of breath, myalgia, and fevers Treatment to date: Xyzal helped Sick contacts: No  Past Medical History:  Diagnosis Date   Anxiety with depression 07/01/2017   Fibromyalgia    Vaginal Pap smear, abnormal    Allergies as of 06/15/2022   No Known Allergies      Medication List        Accurate as of June 15, 2022  2:31 PM. If you have any questions, ask your nurse or doctor.          buPROPion 150 MG 12 hr tablet Commonly known as: Wellbutrin SR Take 1 tablet (150 mg total) by mouth 2 (two) times daily.   busPIRone 15 MG tablet Commonly known as: BUSPAR Take 1 tablet (15 mg total) by mouth 2 (two) times daily.   clotrimazole 1 % cream Commonly known as: LOTRIMIN Apply 1 application topically 2 (two) times daily.   Drysol 20 % external solution Generic drug: aluminum chloride Apply topically at bedtime.   DULoxetine 60 MG capsule Commonly known as: CYMBALTA Take 1  capsule by mouth daily.   fexofenadine 180 MG tablet Commonly known as: ALLEGRA Take 1 tablet by mouth daily.   Fluarix Quadrivalent 0.5 ML injection Generic drug: influenza vac split quadrivalent PF Inject into the muscle.   ibuprofen 400 MG tablet Commonly known as: ADVIL Take 400 mg by mouth every 6 (six) hours as needed.   lactulose 10 g packet Commonly known as: CEPHULAC Take 1-2 packets (10-20 g total) by mouth daily.   linaclotide 145 MCG Caps capsule Commonly known as: LINZESS Take 1 capsule (145 mcg total) by mouth daily before breakfast.   montelukast 10 MG tablet Commonly known as: SINGULAIR Take 1 tablet (10 mg total) by mouth daily.   predniSONE 20 MG tablet Commonly known as: DELTASONE Take 2 tablets (40 mg total) by mouth daily with breakfast for 5 days. Started by: Sharlene Dory, DO   topiramate 25 MG tablet Commonly known as: TOPAMAX Take 1 tablet (25 mg total) by mouth 2 (two) times daily. Take 1 tab daily for 3 days initially.   traZODone 50 MG tablet Commonly known as: DESYREL Take 1/2 to 1 tablet by mouth at bedtime as needed for sleep.   triamcinolone ointment 0.1 % Commonly known as: KENALOG Apply in the ear canal with a q-tip twice daily as directed.   Trulance 3 MG Tabs Generic drug: Plecanatide Take 1 tablet by mouth daily.        Exam No  conversational dyspnea Age appropriate judgment and insight Nml affect and mood  Assessment and Plan  Anxiety with depression  Allergic rhinitis, unspecified seasonality, unspecified trigger - Plan: predniSONE (DELTASONE) 20 MG tablet  Chronic, uncontrolled. Offered medication change which she politely declined. Therapist taking her out of work for 2 weeks. Will be interesting to see how she does.  Acute, self-limited. 5 d pred burst 40 mg/d. Cont Singulair 10 mg/ and Xyzal 5 mg/d.  The patient voiced understanding and agreement to the plan.  Addendum: Patient sent message on 9/29  to inform me she is interested in increasing Cymbalta dosage. Will increase to 60 mg bid and follow up at the end of Oct.   Sharlene Dory, DO 06/15/22 2:31 PM

## 2022-06-22 DIAGNOSIS — F329 Major depressive disorder, single episode, unspecified: Secondary | ICD-10-CM | POA: Diagnosis not present

## 2022-06-22 DIAGNOSIS — F411 Generalized anxiety disorder: Secondary | ICD-10-CM | POA: Diagnosis not present

## 2022-06-28 ENCOUNTER — Encounter (INDEPENDENT_AMBULATORY_CARE_PROVIDER_SITE_OTHER): Payer: Self-pay | Admitting: Family Medicine

## 2022-06-28 ENCOUNTER — Ambulatory Visit (INDEPENDENT_AMBULATORY_CARE_PROVIDER_SITE_OTHER): Payer: BC Managed Care – PPO | Admitting: Family Medicine

## 2022-06-28 VITALS — BP 124/87 | HR 97 | Temp 98.1°F | Ht 68.0 in | Wt 231.0 lb

## 2022-06-28 DIAGNOSIS — E669 Obesity, unspecified: Secondary | ICD-10-CM | POA: Diagnosis not present

## 2022-06-28 DIAGNOSIS — E559 Vitamin D deficiency, unspecified: Secondary | ICD-10-CM

## 2022-06-28 DIAGNOSIS — F3289 Other specified depressive episodes: Secondary | ICD-10-CM

## 2022-06-28 DIAGNOSIS — R632 Polyphagia: Secondary | ICD-10-CM

## 2022-06-28 DIAGNOSIS — Z6835 Body mass index (BMI) 35.0-35.9, adult: Secondary | ICD-10-CM

## 2022-06-28 MED ORDER — BUPROPION HCL ER (SR) 150 MG PO TB12: 150.0000 mg | ORAL_TABLET | Freq: Two times a day (BID) | ORAL | 0 refills | Status: DC

## 2022-06-28 MED ORDER — TOPIRAMATE 50 MG PO TABS: 50.0000 mg | ORAL_TABLET | Freq: Two times a day (BID) | ORAL | 0 refills | Status: DC

## 2022-06-28 MED ORDER — VITAMIN D (ERGOCALCIFEROL) 1.25 MG (50000 UNIT) PO CAPS: 50000.0000 [IU] | ORAL_CAPSULE | ORAL | 0 refills | Status: DC

## 2022-06-29 ENCOUNTER — Encounter: Payer: Self-pay | Admitting: Family Medicine

## 2022-06-29 ENCOUNTER — Other Ambulatory Visit: Payer: Self-pay | Admitting: Family Medicine

## 2022-06-29 DIAGNOSIS — M797 Fibromyalgia: Secondary | ICD-10-CM

## 2022-06-29 MED ORDER — DULOXETINE HCL 60 MG PO CPEP: 60.0000 mg | ORAL_CAPSULE | Freq: Two times a day (BID) | ORAL | 2 refills | Status: DC

## 2022-06-29 NOTE — Addendum Note (Signed)
Addended by: Ames Coupe on: 06/29/2022 03:45 PM   Modules accepted: Level of Service

## 2022-07-03 NOTE — Progress Notes (Unsigned)
Chief Complaint:   OBESITY Kerri Guerra is here to discuss her progress with her obesity treatment plan along with follow-up of her obesity related diagnoses. Kerri Guerra is on the Category 2 Plan and states she is following her eating plan approximately 80% of the time. Kerri Guerra states she is doing a cardio workout video for 30 minutes once weekly.  Today's visit was #: 3 Starting weight: 240 lbs Starting date: 05/15/2022 Today's weight: 231 lbs Today's date: 06/28/22 Total lbs lost to date: 9 Total lbs lost since last in-office visit: -3  Interim History: She is dealing with sinusitis and depression.  She is craving sweets and overeating at night.  She lives alone.  She has a women's support group.  She is logging daily intake.  Her sleep pattern is off.  She is taking Topamax 25 mg twice daily.  Kerri Guerra Parsons was not covered by insurance.  Naltrexone caused constipation.  She was on a round of prednisone.  Subjective:   1. Vitamin D deficiency Last vitamin D level 32.  Complains of fatigue.  2. Polyphagia On topiramate 25 mg twice daily without adverse side effects. Still having evening polyphagia.  3. Other depression Worsening-on leave from work. On duloxetine and bupropion SR 150 mg twice daily. Has good support system.  Assessment/Plan:   1. Vitamin D deficiency Refilled: - Vitamin D, Ergocalciferol, (DRISDOL) 1.25 MG (50000 UNIT) CAPS capsule; Take 1 capsule (50,000 Units total) by mouth every 7 (seven) days.  Dispense: 5 capsule; Refill: 0 Recheck vitamin D level in 3 months.  2. Polyphagia Increased topiramate to 50 mg twice daily. - topiramate (TOPAMAX) 50 MG tablet; Take 1 tablet (50 mg total) by mouth 2 (two) times daily.  Dispense: 60 tablet; Refill: 0  3. Other depression Refilled: - buPROPion (WELLBUTRIN SR) 150 MG 12 hr tablet; Take 1 tablet (150 mg total) by mouth 2 (two) times daily.  Dispense: 60 tablet; Refill: 0 Continue current mood medications; discussed  stress management.  4. Obesity, current BMI 35.2 1.  Work on eating schedule. 2.  Increased protein with meals to reduce evening polyphagia.  Kerri Guerra is currently in the action stage of change. As such, her goal is to continue with weight loss efforts. She has agreed to keeping a food journal and adhering to recommended goals of 1200 calories and 80 grams protein daily.   Exercise goals: All adults should avoid inactivity. Some physical activity is better than none, and adults who participate in any amount of physical activity gain some health benefits.  Behavioral modification strategies: increasing lean protein intake, increasing vegetables, increasing water intake, decreasing eating out, no skipping meals, meal planning and cooking strategies, and better snacking choices.  Kerri Guerra has agreed to follow-up with our clinic in 3 weeks. She was informed of the importance of frequent follow-up visits to maximize her success with intensive lifestyle modifications for her multiple health conditions.   Objective:   Blood pressure 124/87, pulse 97, temperature 98.1 F (36.7 C), height 5\' 8"  (1.727 m), weight 231 lb (104.8 kg), SpO2 99 %. Body mass index is 35.12 kg/m.  General: Cooperative, alert, well developed, in no acute distress. HEENT: Conjunctivae and lids unremarkable. Cardiovascular: Regular rhythm.  Lungs: Normal work of breathing. Neurologic: No focal deficits.   Lab Results  Component Value Date   CREATININE 1.18 01/26/2022   BUN 16 01/26/2022   NA 139 01/26/2022   K 4.2 01/26/2022   CL 109 01/26/2022   CO2 25 01/26/2022   Lab  Results  Component Value Date   ALT 14 01/26/2022   AST 16 01/26/2022   ALKPHOS 59 01/26/2022   BILITOT 0.3 01/26/2022   Lab Results  Component Value Date   HGBA1C 5.6 05/15/2022   Lab Results  Component Value Date   INSULIN 14.3 05/15/2022   Lab Results  Component Value Date   TSH 2.670 05/15/2022   Lab Results  Component Value Date    CHOL 148 05/15/2022   HDL 42 05/15/2022   LDLCALC 91 05/15/2022   TRIG 77 05/15/2022   CHOLHDL 3.5 05/15/2022   Lab Results  Component Value Date   VD25OH 37.88 05/15/2018   VD25OH 21.89 (L) 02/12/2018   VD25OH 22 (L) 11/15/2017   Lab Results  Component Value Date   WBC 4.1 01/26/2022   HGB 13.0 01/26/2022   HCT 39.5 01/26/2022   MCV 88.6 01/26/2022   PLT 314.0 01/26/2022   Lab Results  Component Value Date   FERRITIN 80 05/15/2022    Attestation Statements:   Reviewed by clinician on day of visit: allergies, medications, problem list, medical history, surgical history, family history, social history, and previous encounter notes.  I, Georgianne Fick, FNP, am acting as transcriptionist for Dr. Loyal Gambler.  I have reviewed the above documentation for accuracy and completeness, and I agree with the above. Dell Ponto, DO

## 2022-07-06 DIAGNOSIS — F329 Major depressive disorder, single episode, unspecified: Secondary | ICD-10-CM | POA: Diagnosis not present

## 2022-07-06 DIAGNOSIS — F411 Generalized anxiety disorder: Secondary | ICD-10-CM | POA: Diagnosis not present

## 2022-07-19 ENCOUNTER — Encounter (INDEPENDENT_AMBULATORY_CARE_PROVIDER_SITE_OTHER): Payer: Self-pay | Admitting: Family Medicine

## 2022-07-19 ENCOUNTER — Ambulatory Visit (INDEPENDENT_AMBULATORY_CARE_PROVIDER_SITE_OTHER): Payer: BC Managed Care – PPO | Admitting: Family Medicine

## 2022-07-19 VITALS — BP 125/85 | HR 77 | Temp 98.1°F | Ht 68.0 in | Wt 229.0 lb

## 2022-07-19 DIAGNOSIS — F418 Other specified anxiety disorders: Secondary | ICD-10-CM | POA: Diagnosis not present

## 2022-07-19 DIAGNOSIS — R632 Polyphagia: Secondary | ICD-10-CM

## 2022-07-19 DIAGNOSIS — E559 Vitamin D deficiency, unspecified: Secondary | ICD-10-CM | POA: Diagnosis not present

## 2022-07-19 DIAGNOSIS — Z6834 Body mass index (BMI) 34.0-34.9, adult: Secondary | ICD-10-CM

## 2022-07-19 DIAGNOSIS — E669 Obesity, unspecified: Secondary | ICD-10-CM | POA: Diagnosis not present

## 2022-07-19 MED ORDER — TOPIRAMATE 100 MG PO TABS: 100.0000 mg | ORAL_TABLET | Freq: Every day | ORAL | 0 refills | Status: DC

## 2022-07-20 DIAGNOSIS — F329 Major depressive disorder, single episode, unspecified: Secondary | ICD-10-CM | POA: Diagnosis not present

## 2022-07-20 DIAGNOSIS — F411 Generalized anxiety disorder: Secondary | ICD-10-CM | POA: Diagnosis not present

## 2022-07-26 ENCOUNTER — Encounter (INDEPENDENT_AMBULATORY_CARE_PROVIDER_SITE_OTHER): Payer: Self-pay

## 2022-07-26 DIAGNOSIS — F411 Generalized anxiety disorder: Secondary | ICD-10-CM | POA: Diagnosis not present

## 2022-07-26 DIAGNOSIS — F329 Major depressive disorder, single episode, unspecified: Secondary | ICD-10-CM | POA: Diagnosis not present

## 2022-07-31 NOTE — Progress Notes (Signed)
Chief Complaint:   OBESITY Kerri Guerra is here to discuss her progress with her obesity treatment plan along with follow-up of her obesity related diagnoses. [name] is on keeping a food journal and adhering to recommended goals of 1200 calories and 80 protein and states she is following her eating plan approximately 45% of the time. [name] states she is gym workouts 60 minutes 2 times per week.  Today's visit was #: 4 Starting weight: 240 lbs Starting date: 05/15/2022 Today's weight: 229 lbs Today's date: 07/19/2022 Total lbs lost to date: 11 lbs Total lbs lost since last in-office visit: 2 lbs  Interim History: She is sleeping better now.  She has been eating more fast food and has had more IBS problems.  She has struggled with depression.  Mindful of calories.  Choosing long life meals and treats.  Was indulging in krispey creme and hostess cupcakes.  Started online cooking class.  She did a hike and went back to the gym.  Went back to work and has increased water intake.   Subjective:   1. Polyphagia Symptoms are worse at night and related to mood. She has been less hungry during the day.   2. Vitamin D deficiency She is currently taking prescription vitamin D 50,000 IU each week. She denies nausea, vomiting or muscle weakness.  Last Vitamin D level 32.   3. Anxiety with depression She is on Wellbutrin SR 150 mg BID and Cymbalta 60 mg BID. Mood has been a barrier to her progress.   Assessment/Plan:   1. Polyphagia Change Topamax to 100 mg in the evenings.  Reminded to get protein in instead of meal skipping.   Increase - topiramate (TOPAMAX) 100 MG tablet; Take 1 tablet (100 mg total) by mouth daily.  Dispense: 30 tablet; Refill: 0  2. Vitamin D deficiency Continue Vitamin D, 50,000 IU weekly and refill.   3. Anxiety with depression Recommend outside counseling, continue current medications.   4. Obesity,current BMI 34.9 Kerri is currently in the action stage of  change. As such, her goal is to continue with weight loss efforts. She has agreed to keeping a food journal and adhering to recommended goals of 1200 calories and 80-90 protein.   Exercise goals:  go to the gym 2+ days per week.   Behavioral modification strategies: increasing lean protein intake, increasing vegetables, increasing water intake, increasing high fiber foods, decreasing eating out, no skipping meals, meal planning and cooking strategies, keeping healthy foods in the home, and decreasing junk food.  Guerra has agreed to follow-up with our clinic in 3 weeks. She was informed of the importance of frequent follow-up visits to maximize her success with intensive lifestyle modifications for her multiple health conditions.   Objective:   Blood pressure 125/85, pulse 77, temperature 98.1 F (36.7 C), height '5\' 8"'$  (1.727 m), weight 229 lb (103.9 kg), SpO2 100 %. Body mass index is 34.82 kg/m.  General: Cooperative, alert, well developed, in no acute distress. HEENT: Conjunctivae and lids unremarkable. Cardiovascular: Regular rhythm.  Lungs: Normal work of breathing. Neurologic: No focal deficits.   Lab Results  Component Value Date   CREATININE 1.18 01/26/2022   BUN 16 01/26/2022   NA 139 01/26/2022   K 4.2 01/26/2022   CL 109 01/26/2022   CO2 25 01/26/2022   Lab Results  Component Value Date   ALT 14 01/26/2022   AST 16 01/26/2022   ALKPHOS 59 01/26/2022   BILITOT 0.3 01/26/2022   Lab  Results  Component Value Date   HGBA1C 5.6 05/15/2022   Lab Results  Component Value Date   INSULIN 14.3 05/15/2022   Lab Results  Component Value Date   TSH 2.670 05/15/2022   Lab Results  Component Value Date   CHOL 148 05/15/2022   HDL 42 05/15/2022   LDLCALC 91 05/15/2022   TRIG 77 05/15/2022   CHOLHDL 3.5 05/15/2022   Lab Results  Component Value Date   VD25OH 37.88 05/15/2018   VD25OH 21.89 (L) 02/12/2018   VD25OH 22 (L) 11/15/2017   Lab Results  Component  Value Date   WBC 4.1 01/26/2022   HGB 13.0 01/26/2022   HCT 39.5 01/26/2022   MCV 88.6 01/26/2022   PLT 314.0 01/26/2022   Lab Results  Component Value Date   FERRITIN 80 05/15/2022   Attestation Statements:   Reviewed by clinician on day of visit: allergies, medications, problem list, medical history, surgical history, family history, social history, and previous encounter notes.  I, Davy Pique, am acting as Location manager for Loyal Gambler, DO.  I have reviewed the above documentation for accuracy and completeness, and I agree with the above. Dell Ponto, DO

## 2022-08-03 DIAGNOSIS — F411 Generalized anxiety disorder: Secondary | ICD-10-CM | POA: Diagnosis not present

## 2022-08-03 DIAGNOSIS — F329 Major depressive disorder, single episode, unspecified: Secondary | ICD-10-CM | POA: Diagnosis not present

## 2022-08-15 ENCOUNTER — Ambulatory Visit (INDEPENDENT_AMBULATORY_CARE_PROVIDER_SITE_OTHER): Payer: BC Managed Care – PPO | Admitting: Family Medicine

## 2022-08-15 ENCOUNTER — Encounter (INDEPENDENT_AMBULATORY_CARE_PROVIDER_SITE_OTHER): Payer: Self-pay | Admitting: Family Medicine

## 2022-08-15 VITALS — BP 135/82 | HR 58 | Temp 98.3°F | Ht 68.0 in | Wt 219.0 lb

## 2022-08-15 DIAGNOSIS — Z6833 Body mass index (BMI) 33.0-33.9, adult: Secondary | ICD-10-CM

## 2022-08-15 DIAGNOSIS — E669 Obesity, unspecified: Secondary | ICD-10-CM

## 2022-08-15 DIAGNOSIS — Z6836 Body mass index (BMI) 36.0-36.9, adult: Secondary | ICD-10-CM

## 2022-08-15 DIAGNOSIS — F411 Generalized anxiety disorder: Secondary | ICD-10-CM | POA: Diagnosis not present

## 2022-08-15 DIAGNOSIS — F3289 Other specified depressive episodes: Secondary | ICD-10-CM

## 2022-08-15 DIAGNOSIS — E559 Vitamin D deficiency, unspecified: Secondary | ICD-10-CM | POA: Diagnosis not present

## 2022-08-15 DIAGNOSIS — F329 Major depressive disorder, single episode, unspecified: Secondary | ICD-10-CM | POA: Diagnosis not present

## 2022-08-15 MED ORDER — BUPROPION HCL ER (SR) 150 MG PO TB12: 150.0000 mg | ORAL_TABLET | Freq: Two times a day (BID) | ORAL | 0 refills | Status: DC

## 2022-08-15 MED ORDER — VITAMIN D (ERGOCALCIFEROL) 1.25 MG (50000 UNIT) PO CAPS: 50000.0000 [IU] | ORAL_CAPSULE | ORAL | 0 refills | Status: DC

## 2022-08-15 MED ORDER — TOPIRAMATE 100 MG PO TABS: 100.0000 mg | ORAL_TABLET | Freq: Every day | ORAL | 0 refills | Status: DC

## 2022-08-28 NOTE — Progress Notes (Signed)
Chief Complaint:   OBESITY Kerri Guerra is here to discuss her progress with her obesity treatment plan along with follow-up of her obesity related diagnoses. Kerri Guerra is on keeping a food journal and adhering to recommended goals of 1200 calories and 80-90 grams of protein and states she is following her eating plan approximately 40% of the time. Kerri Guerra states she is lifting weights and doing cardio for 30-60 minutes 2 times per week.  Today's visit was #: 5 Starting weight: 240 lbs Starting date: 05/15/2022 Today's weight: 219 lbs Today's date: 08/15/2022 Total lbs lost to date: 21 Total lbs lost since last in-office visit: 10  Interim History: Kerri Guerra has done well with her weight loss, but she does report eating less overall calories.  We discussed concerns about decreased metabolism if she is getting too few calories and protein.  She does report feeling hungry later in the day for dinner now, but she is not binging at night like she was previously.  Subjective:   1. Vitamin D deficiency Kerri Guerra is taking vitamin D prescription with no side effects noted.  2. Other depression Kerri Guerra is taking Wellbutrin and Topamax with no side effects noted.  Assessment/Plan:   1. Vitamin D deficiency Kerri Guerra will continue prescription Vitamin D 50,000 IU every week, and we will refill for 1 month.  She will follow-up for routine testing of Vitamin D, at least 2-3 times per year to avoid over-replacement.  - Vitamin D, Ergocalciferol, (DRISDOL) 1.25 MG (50000 UNIT) CAPS capsule; Take 1 capsule (50,000 Units total) by mouth every 7 (seven) days.  Dispense: 5 capsule; Refill: 0  2. Other depression Kerri Guerra continue her medications, and we will refill Wellbutrin SR and Topamax for 1 month.  - buPROPion (WELLBUTRIN SR) 150 MG 12 hr tablet; Take 1 tablet (150 mg total) by mouth 2 (two) times daily.  Dispense: 60 tablet; Refill: 0 - topiramate (TOPAMAX) 100 MG tablet; Take 1 tablet (100 mg total)  by mouth daily.  Dispense: 30 tablet; Refill: 0  3. Obesity, Current BMI 33.4 Kerri Guerra is currently in the action stage of change. As such, her goal is to continue with weight loss efforts. She has agreed to keeping a food journal and adhering to recommended goals of 1200-1300 calories and 85+ grams of protein daily.   Exercise goals: As is.   Behavioral modification strategies: increasing lean protein intake, no skipping meals, holiday eating strategies , and celebration eating strategies.  Kerri Guerra has agreed to follow-up with our clinic in 4 weeks. She was informed of the importance of frequent follow-up visits to maximize her success with intensive lifestyle modifications for her multiple health conditions.   Objective:   Blood pressure 135/82, pulse (!) 58, temperature 98.3 F (36.8 C), height 5\' 8"  (1.727 m), weight 219 lb (99.3 kg), SpO2 94 %. Body mass index is 33.3 kg/m.  General: Cooperative, alert, well developed, in no acute distress. HEENT: Conjunctivae and lids unremarkable. Cardiovascular: Regular rhythm.  Lungs: Normal work of breathing. Neurologic: No focal deficits.   Lab Results  Component Value Date   CREATININE 1.18 01/26/2022   BUN 16 01/26/2022   NA 139 01/26/2022   K 4.2 01/26/2022   CL 109 01/26/2022   CO2 25 01/26/2022   Lab Results  Component Value Date   ALT 14 01/26/2022   AST 16 01/26/2022   ALKPHOS 59 01/26/2022   BILITOT 0.3 01/26/2022   Lab Results  Component Value Date   HGBA1C 5.6 05/15/2022  Lab Results  Component Value Date   INSULIN 14.3 05/15/2022   Lab Results  Component Value Date   TSH 2.670 05/15/2022   Lab Results  Component Value Date   CHOL 148 05/15/2022   HDL 42 05/15/2022   LDLCALC 91 05/15/2022   TRIG 77 05/15/2022   CHOLHDL 3.5 05/15/2022   Lab Results  Component Value Date   VD25OH 37.88 05/15/2018   VD25OH 21.89 (L) 02/12/2018   VD25OH 22 (L) 11/15/2017   Lab Results  Component Value Date   WBC 4.1  01/26/2022   HGB 13.0 01/26/2022   HCT 39.5 01/26/2022   MCV 88.6 01/26/2022   PLT 314.0 01/26/2022   Lab Results  Component Value Date   FERRITIN 80 05/15/2022   Attestation Statements:   Reviewed by clinician on day of visit: allergies, medications, problem list, medical history, surgical history, family history, social history, and previous encounter notes.   I, Burt Knack, am acting as transcriptionist for Quillian Quince, MD.  I have reviewed the above documentation for accuracy and completeness, and I agree with the above. -  Quillian Quince, MD

## 2022-08-31 DIAGNOSIS — F329 Major depressive disorder, single episode, unspecified: Secondary | ICD-10-CM | POA: Diagnosis not present

## 2022-08-31 DIAGNOSIS — F411 Generalized anxiety disorder: Secondary | ICD-10-CM | POA: Diagnosis not present

## 2022-09-06 DIAGNOSIS — F411 Generalized anxiety disorder: Secondary | ICD-10-CM | POA: Diagnosis not present

## 2022-09-06 DIAGNOSIS — F329 Major depressive disorder, single episode, unspecified: Secondary | ICD-10-CM | POA: Diagnosis not present

## 2022-09-12 ENCOUNTER — Ambulatory Visit (INDEPENDENT_AMBULATORY_CARE_PROVIDER_SITE_OTHER): Payer: BC Managed Care – PPO | Admitting: Family Medicine

## 2022-09-12 ENCOUNTER — Encounter (INDEPENDENT_AMBULATORY_CARE_PROVIDER_SITE_OTHER): Payer: Self-pay | Admitting: Family Medicine

## 2022-09-12 VITALS — BP 123/82 | HR 82 | Temp 98.4°F | Ht 68.0 in | Wt 224.0 lb

## 2022-09-12 DIAGNOSIS — R632 Polyphagia: Secondary | ICD-10-CM

## 2022-09-12 DIAGNOSIS — Z6834 Body mass index (BMI) 34.0-34.9, adult: Secondary | ICD-10-CM

## 2022-09-12 DIAGNOSIS — F3289 Other specified depressive episodes: Secondary | ICD-10-CM

## 2022-09-12 DIAGNOSIS — E559 Vitamin D deficiency, unspecified: Secondary | ICD-10-CM

## 2022-09-12 DIAGNOSIS — E669 Obesity, unspecified: Secondary | ICD-10-CM

## 2022-09-12 MED ORDER — TOPIRAMATE 100 MG PO TABS: 100.0000 mg | ORAL_TABLET | Freq: Every day | ORAL | 0 refills | Status: DC

## 2022-09-12 MED ORDER — BUPROPION HCL ER (XL) 150 MG PO TB24: 150.0000 mg | ORAL_TABLET | Freq: Every day | ORAL | 0 refills | Status: DC

## 2022-09-13 ENCOUNTER — Encounter (INDEPENDENT_AMBULATORY_CARE_PROVIDER_SITE_OTHER): Payer: Self-pay

## 2022-09-14 DIAGNOSIS — F329 Major depressive disorder, single episode, unspecified: Secondary | ICD-10-CM | POA: Diagnosis not present

## 2022-09-14 DIAGNOSIS — F411 Generalized anxiety disorder: Secondary | ICD-10-CM | POA: Diagnosis not present

## 2022-09-17 ENCOUNTER — Other Ambulatory Visit: Payer: Self-pay | Admitting: Family Medicine

## 2022-09-17 ENCOUNTER — Telehealth (INDEPENDENT_AMBULATORY_CARE_PROVIDER_SITE_OTHER): Payer: Self-pay | Admitting: Family Medicine

## 2022-09-17 NOTE — Telephone Encounter (Signed)
Phone call to patient to go over medications to make sure she was taking the correct dose. Patient understood and had no further questions.

## 2022-09-17 NOTE — Telephone Encounter (Signed)
Left patient a message to call me back so I can go over her medication with her to make sure she is taking the correct dose.

## 2022-09-17 NOTE — Telephone Encounter (Signed)
Patient returned Kerri Guerra call. Please reach back out to patient

## 2022-09-18 NOTE — Telephone Encounter (Signed)
Patient called back and we went over medication she understands what she is to be taking and how much.

## 2022-09-20 NOTE — Progress Notes (Signed)
Chief Complaint:   OBESITY Kerri Guerra is here to discuss her progress with her obesity treatment plan along with follow-up of her obesity related diagnoses. Kerri Guerra is on keeping a food journal and adhering to recommended goals of 1200-1300 calories and 85 protein and states she is following her eating plan approximately 10% of the time. Kerri Guerra states she is gym 30-60 minutes 2 times per week.  Today's visit was #: 6 Starting weight: 240 lbs Starting date: 05/15/2022 Today's weight: 224 lbs Today's date: 09/12/2022 Total lbs lost to date: 16 lbs Total lbs lost since last in-office visit: +5 lbs  Interim History: Net decrease 16 lbs in 4 months of medically supervised weight management.  This is a 6.6% TBW loss.  celebratedThanksgiving, birthday and went on a cruise.  did not take medicine on cruise.   getting back on track.  Subjective:   1. Polyphagia Worsened off Topamax while on a cruise.  Has done well on Topamax 100 mg daily.  Using condoms for contraception while on Topamax.  2. Vitamin D deficiency Has not been taking vitamin D prescription.  Vitamin D level 32 on 05/15/2022.  3. Other depression with emotional eating She is doing well on Wellbutrin SR 150 mg twice daily.  She has been taking 2 tablets at once.  She notes pausing Wellbutrin while she was on her cruise.  Assessment/Plan:   1. Polyphagia Resume Topamax 100 mg each evening.  Refill- topiramate (TOPAMAX) 100 MG tablet; Take 1 tablet (100 mg total) by mouth daily.  Dispense: 30 tablet; Refill: 0  2. Vitamin D deficiency Resume vitamin D 50,000 IU weekly.  Recheck vitamin D level in 1 to 2 months.  3. Other depression with emotional eating Change- buPROPion (WELLBUTRIN XL) 150 MG 24 hr tablet; Take 1 tablet (150 mg total) by mouth daily.  Dispense: 30 tablet; Refill: 0  4. Obesity,current BMI 34.1 Resume gym workout 2-3 times per week.  Kerri Guerra is currently in the action stage of change. As such, her  goal is to continue with weight loss efforts. She has agreed to keeping a food journal and adhering to recommended goals of 1300 calories and 85+ protein daily.  Exercise goals:  Gym 2-3 times per week.  Behavioral modification strategies: increasing lean protein intake, increasing vegetables, increasing water intake, decreasing eating out, no skipping meals, meal planning and cooking strategies, keeping healthy foods in the home, and holiday eating strategies decreasing junk food.   Kerri Guerra has agreed to follow-up with our clinic in 4 weeks. She was informed of the importance of frequent follow-up visits to maximize her success with intensive lifestyle modifications for her multiple health conditions.   Objective:   Blood pressure 123/82, pulse 82, temperature 98.4 F (36.9 C), height 5\' 8"  (1.727 m), weight 224 lb (101.6 kg), SpO2 99 %. Body mass index is 34.06 kg/m.  General: Cooperative, alert, well developed, in no acute distress. HEENT: Conjunctivae and lids unremarkable. Cardiovascular: Regular rhythm.  Lungs: Normal work of breathing. Neurologic: No focal deficits.   Lab Results  Component Value Date   CREATININE 1.18 01/26/2022   BUN 16 01/26/2022   NA 139 01/26/2022   K 4.2 01/26/2022   CL 109 01/26/2022   CO2 25 01/26/2022   Lab Results  Component Value Date   ALT 14 01/26/2022   AST 16 01/26/2022   ALKPHOS 59 01/26/2022   BILITOT 0.3 01/26/2022   Lab Results  Component Value Date   HGBA1C 5.6 05/15/2022  Lab Results  Component Value Date   INSULIN 14.3 05/15/2022   Lab Results  Component Value Date   TSH 2.670 05/15/2022   Lab Results  Component Value Date   CHOL 148 05/15/2022   HDL 42 05/15/2022   LDLCALC 91 05/15/2022   TRIG 77 05/15/2022   CHOLHDL 3.5 05/15/2022   Lab Results  Component Value Date   VD25OH 37.88 05/15/2018   VD25OH 21.89 (L) 02/12/2018   VD25OH 22 (L) 11/15/2017   Lab Results  Component Value Date   WBC 4.1 01/26/2022    HGB 13.0 01/26/2022   HCT 39.5 01/26/2022   MCV 88.6 01/26/2022   PLT 314.0 01/26/2022   Lab Results  Component Value Date   FERRITIN 80 05/15/2022   Attestation Statements:   Reviewed by clinician on day of visit: allergies, medications, problem list, medical history, surgical history, family history, social history, and previous encounter notes.  I, Malcolm Metro, am acting as Energy manager for Seymour Bars, DO.  I have reviewed the above documentation for accuracy and completeness, and I agree with the above. Glennis Brink, DO

## 2022-09-26 ENCOUNTER — Encounter: Payer: Self-pay | Admitting: Family Medicine

## 2022-09-26 ENCOUNTER — Telehealth (INDEPENDENT_AMBULATORY_CARE_PROVIDER_SITE_OTHER): Payer: BC Managed Care – PPO | Admitting: Family Medicine

## 2022-09-26 DIAGNOSIS — K5909 Other constipation: Secondary | ICD-10-CM | POA: Diagnosis not present

## 2022-09-26 DIAGNOSIS — M797 Fibromyalgia: Secondary | ICD-10-CM

## 2022-09-26 MED ORDER — DULOXETINE HCL 60 MG PO CPEP: 60.0000 mg | ORAL_CAPSULE | Freq: Every day | ORAL | 2 refills | Status: AC

## 2022-09-26 NOTE — Progress Notes (Signed)
CC: Medication review  Subjective: Patient is a 46 y.o. female here for f/u. Due to COVID-19 pandemic, we are interacting via web portal for an electronic face-to-face visit. I verified patient's ID using 2 identifiers. Patient agreed to proceed with visit via this method. Patient is at home, I am at office. Patient and I are present for visit.   Patient has a history of fibromyalgia.  She is currently taking Cymbalta 60 mg daily.  It is written for twice daily and she wanted to verify what she should do.  She is also taking Wellbutrin for emotional eating and trazodone as needed for sleep.  She reports compliance and no adverse effects.  She feels pretty well-controlled overall.  She notes that her mood is worse during the winter months.  She looked into light therapy in the past but notes it was around $100.  Patient has a history of chronic constipation.  She is currently taking Trulance 3 mg daily.  She has difficulty anticipating when she will have a bowel movement which is sometimes loose.  If she takes it before bedtime, she has had bowel movements in her sleep before.  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  Past Medical History:  Diagnosis Date   Anxiety with depression 07/01/2017   Fibromyalgia    Vaginal Pap smear, abnormal     Objective: No conversational dyspnea Age appropriate judgment and insight Nml affect and mood  Assessment and Plan: Chronic constipation  Fibromyalgia - Plan: DULoxetine (CYMBALTA) 60 MG capsule  Continue Cymbalta 60 mg daily.  For her mood, she will look into like therapy as are much cheaper options online. She will take Trulance 3 mg every other day.  I would recommend taking late afternoon so effects are not as strong in the nighttime.  It is not feasible to cut this medication in half and fortunately.  This also makes me wonder if she is still having chronic constipation symptoms/actually requiring the  medication. The patient voiced understanding and agreement to the plan.  Jilda Roche [place], DO 09/26/22  4:21 PM

## 2022-09-27 ENCOUNTER — Encounter: Payer: Self-pay | Admitting: Allergy & Immunology

## 2022-09-27 ENCOUNTER — Ambulatory Visit (INDEPENDENT_AMBULATORY_CARE_PROVIDER_SITE_OTHER): Payer: BC Managed Care – PPO | Admitting: Allergy & Immunology

## 2022-09-27 ENCOUNTER — Other Ambulatory Visit: Payer: Self-pay

## 2022-09-27 VITALS — BP 116/64 | HR 84 | Temp 97.7°F | Resp 18 | Ht 68.0 in | Wt 233.1 lb

## 2022-09-27 DIAGNOSIS — L299 Pruritus, unspecified: Secondary | ICD-10-CM

## 2022-09-27 DIAGNOSIS — J3089 Other allergic rhinitis: Secondary | ICD-10-CM

## 2022-09-27 DIAGNOSIS — J302 Other seasonal allergic rhinitis: Secondary | ICD-10-CM

## 2022-09-27 MED ORDER — LEVOCETIRIZINE DIHYDROCHLORIDE 5 MG PO TABS: 5.0000 mg | ORAL_TABLET | Freq: Every evening | ORAL | 2 refills | Status: AC

## 2022-09-27 MED ORDER — MONTELUKAST SODIUM 10 MG PO TABS: 10.0000 mg | ORAL_TABLET | Freq: Every day | ORAL | 2 refills | Status: DC

## 2022-09-27 NOTE — Patient Instructions (Addendum)
1. Season and perennial allergic rhinitis (trees, weeds, grasses, indoor molds, outdoor molds, dust mites, cat, dog and cockroach) - Continue with montelukast 10 mg once a day (this works in conjunction with the antihistamines). - You can double up on the antihistamines if needed for the best control (Allegra in the morning and Xyzal at night, etc).  - Restart with triamcinolone 0.1% ointment applied via Q-tip twice daily to the ear canals as needed for itching.   2. Return in about 1 year (around 09/28/2023).    Please inform us of any Emergency Department visits, hospitalizations, or changes in symptoms. Call us before going to the ED for breathing or allergy symptoms since we might be able to fit you in for a sick visit. Feel free to contact us anytime with any questions, problems, or concerns.  It was a pleasure to see you again today!   Websites that have reliable patient information: 1. American Academy of Asthma, Allergy, and Immunology: www.aaaai.org 2. Food Allergy Research and Education (FARE): foodallergy.org 3. Mothers of Asthmatics: http://www.asthmacommunitynetwork.org 4. American College of Allergy, Asthma, and Immunology: www.acaai.org  "Like" [place] on Facebook and Instagram for our latest updates!        Make sure you are registered to vote! If you have moved or changed any of your contact information, you will need to get this updated before voting!  In some cases, you MAY be able to register to vote online: [email or web address]

## 2022-09-27 NOTE — Progress Notes (Signed)
FOLLOW UP  Date of Service/Encounter:  09/27/22   Assessment:   Seasonal and perennial allergic rhinitis (trees, weeds, grasses, indoor molds, outdoor molds, dust mites, cat, dog and cockroach)   Ear itching  Plan/Recommendations:    1. Season and perennial allergic rhinitis (trees, weeds, grasses, indoor molds, outdoor molds, dust mites, cat, dog and cockroach) - Continue with montelukast 10 mg once a day (this works in conjunction with the antihistamines). - You can double up on the antihistamines if needed for the best control (Allegra in the morning and Xyzal at night, etc).  - Restart with triamcinolone 0.1% ointment applied via Q-tip twice daily to the ear canals as needed for itching.   2. Return in about 1 year (around 09/28/2023).   Subjective:   Kerri Guerra is a 46 y.o. 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  Kerri Guerra is a 46 y.o. female presenting for a follow up visit.  She was last seen in our end of September 2022.  At that time, we continue  with montelukast as well as alternating antihistamines.  We also continue with triamcinolone to ear canals for itching.  Since the last visit, she has dine well. She is using levocetirizine in the morning and montelukast at night. This is working fairly well. She feels that the montelukast is not as effective as it was previously. She also has noticed that her nostrils are stuffy.  She has never been on allergen immunotherapy.  She is having breakthrough symptoms year round. She only recently started the levocetirizine, but she was having some symptoms on the Allegra. She has not been using her topical steorid in her ears like she used it. She forgets about it.  She will start this up again.  She has been playing around with a video ear piece that connects to her phone.  This allows her to see her earwax in her ears.  She admits she is a little OCD about the lack in the ears.  Otherwise, there have been no changes to her past medical history, surgical history, family history, or social history.    Review of Systems  Constitutional: Negative.  Negative for chills, fever, malaise/fatigue and weight loss.  HENT:  Negative for congestion, ear discharge, ear pain and sinus pain.        Positive for ear itching.  Eyes:  Negative for pain, discharge and redness.  Respiratory:  Negative for cough, sputum production, shortness of breath and wheezing.   Cardiovascular: Negative.  Negative for chest pain and palpitations.  Gastrointestinal:  Negative for abdominal pain, constipation, diarrhea,  heartburn, nausea and vomiting.  Skin: Negative.  Negative for itching and rash.  Neurological:  Negative for dizziness and headaches.  Endo/Heme/Allergies:  Positive for environmental allergies. Does not bruise/bleed easily.       Objective:   Blood pressure 116/64, pulse 84, temperature 97.7 F (36.5 C), temperature source Temporal, resp. rate 18, height 5\' 8"  (1.727 m), weight 233 lb 1.6 oz (105.7 kg),  SpO2 97 %. Body mass index is 35.44 kg/m.    Physical Exam Vitals reviewed.  Constitutional:      Appearance: She is well-developed.  HENT:     Head: Normocephalic and atraumatic.     Right Ear: Tympanic membrane, ear canal and external ear normal.     Left Ear: Tympanic membrane, ear canal and external ear normal.     Ears:     Comments: There are some flaking around the external auditory canal bilaterally.    Nose: No nasal deformity, septal deviation, mucosal edema or rhinorrhea.     Right Turbinates: Enlarged and swollen.     Left Turbinates: Enlarged and swollen.     Right Sinus: No maxillary sinus tenderness or frontal sinus tenderness.     Left Sinus: No maxillary sinus tenderness or frontal sinus tenderness.     Mouth/Throat:     Mouth: Mucous membranes are not pale and not dry.     Pharynx: Uvula midline.  Eyes:     General: Lids are normal. No allergic shiner.       Right eye: No discharge.        Left eye: No discharge.     Conjunctiva/sclera: Conjunctivae normal.     Right eye: Right conjunctiva is not injected. No chemosis.    Left eye: Left conjunctiva is not injected. No chemosis.    Pupils: Pupils are equal, round, and reactive to light.  Cardiovascular:     Rate and Rhythm: Normal rate and regular rhythm.     Heart sounds: Normal heart sounds.  Pulmonary:     Effort: Pulmonary effort is normal. No tachypnea, accessory muscle usage or respiratory distress.     Breath sounds: Normal breath sounds. No wheezing, rhonchi or rales.  Chest:     Chest wall: No tenderness.  Lymphadenopathy:     Cervical: No cervical adenopathy.  Skin:    Coloration: Skin is not pale.     Findings: No abrasion, erythema, petechiae or rash. Rash is not papular, urticarial or vesicular.  Neurological:     Mental Status: She is alert.  Psychiatric:        Behavior: Behavior is cooperative.      Diagnostic studies: none       , MD  [facility]

## 2022-09-28 ENCOUNTER — Encounter: Payer: Self-pay | Admitting: Allergy & Immunology

## 2022-09-28 MED ORDER — TRIAMCINOLONE ACETONIDE 0.1 % EX OINT: TOPICAL_OINTMENT | CUTANEOUS | 5 refills | Status: DC

## 2022-10-04 ENCOUNTER — Ambulatory Visit: Payer: BC Managed Care – PPO | Admitting: Allergy & Immunology

## 2022-10-12 DIAGNOSIS — F411 Generalized anxiety disorder: Secondary | ICD-10-CM | POA: Diagnosis not present

## 2022-10-12 DIAGNOSIS — F329 Major depressive disorder, single episode, unspecified: Secondary | ICD-10-CM | POA: Diagnosis not present

## 2022-10-17 ENCOUNTER — Ambulatory Visit (INDEPENDENT_AMBULATORY_CARE_PROVIDER_SITE_OTHER): Payer: BC Managed Care – PPO | Admitting: Family Medicine

## 2022-10-18 DIAGNOSIS — F411 Generalized anxiety disorder: Secondary | ICD-10-CM | POA: Diagnosis not present

## 2022-10-18 DIAGNOSIS — F329 Major depressive disorder, single episode, unspecified: Secondary | ICD-10-CM | POA: Diagnosis not present

## 2022-10-22 ENCOUNTER — Encounter (INDEPENDENT_AMBULATORY_CARE_PROVIDER_SITE_OTHER): Payer: Self-pay | Admitting: Family Medicine

## 2022-10-22 ENCOUNTER — Ambulatory Visit (INDEPENDENT_AMBULATORY_CARE_PROVIDER_SITE_OTHER): Payer: BC Managed Care – PPO | Admitting: Family Medicine

## 2022-10-22 VITALS — BP 122/84 | HR 74 | Temp 98.6°F | Ht 68.0 in | Wt 230.0 lb

## 2022-10-22 DIAGNOSIS — R632 Polyphagia: Secondary | ICD-10-CM | POA: Diagnosis not present

## 2022-10-22 DIAGNOSIS — E669 Obesity, unspecified: Secondary | ICD-10-CM

## 2022-10-22 DIAGNOSIS — F3289 Other specified depressive episodes: Secondary | ICD-10-CM | POA: Diagnosis not present

## 2022-10-22 DIAGNOSIS — Z6835 Body mass index (BMI) 35.0-35.9, adult: Secondary | ICD-10-CM

## 2022-10-22 DIAGNOSIS — E559 Vitamin D deficiency, unspecified: Secondary | ICD-10-CM

## 2022-10-22 MED ORDER — TOPIRAMATE 100 MG PO TABS: 100.0000 mg | ORAL_TABLET | Freq: Every day | ORAL | 0 refills | Status: DC

## 2022-10-22 MED ORDER — VITAMIN D (ERGOCALCIFEROL) 1.25 MG (50000 UNIT) PO CAPS: 50000.0000 [IU] | ORAL_CAPSULE | ORAL | 0 refills | Status: DC

## 2022-10-22 MED ORDER — BUPROPION HCL ER (XL) 150 MG PO TB24: 150.0000 mg | ORAL_TABLET | Freq: Every day | ORAL | 0 refills | Status: DC

## 2022-10-24 ENCOUNTER — Ambulatory Visit (INDEPENDENT_AMBULATORY_CARE_PROVIDER_SITE_OTHER): Payer: BC Managed Care – PPO | Admitting: Obstetrics & Gynecology

## 2022-10-24 ENCOUNTER — Other Ambulatory Visit (HOSPITAL_COMMUNITY)
Admission: RE | Admit: 2022-10-24 | Discharge: 2022-10-24 | Disposition: A | Discharge status: Home / Self Care | Payer: BC Managed Care – PPO | Source: Ambulatory Visit | Attending: Obstetrics & Gynecology | Admitting: Obstetrics & Gynecology

## 2022-10-24 ENCOUNTER — Other Ambulatory Visit (INDEPENDENT_AMBULATORY_CARE_PROVIDER_SITE_OTHER): Payer: Self-pay | Admitting: Family Medicine

## 2022-10-24 ENCOUNTER — Other Ambulatory Visit (HOSPITAL_BASED_OUTPATIENT_CLINIC_OR_DEPARTMENT_OTHER): Payer: Self-pay

## 2022-10-24 ENCOUNTER — Encounter: Payer: Self-pay | Admitting: Obstetrics & Gynecology

## 2022-10-24 VITALS — BP 121/77 | HR 90 | Ht 67.0 in | Wt 236.0 lb

## 2022-10-24 DIAGNOSIS — N898 Other specified noninflammatory disorders of vagina: Secondary | ICD-10-CM | POA: Diagnosis not present

## 2022-10-24 DIAGNOSIS — E559 Vitamin D deficiency, unspecified: Secondary | ICD-10-CM

## 2022-10-24 DIAGNOSIS — Z1231 Encounter for screening mammogram for malignant neoplasm of breast: Secondary | ICD-10-CM

## 2022-10-24 DIAGNOSIS — Z01419 Encounter for gynecological examination (general) (routine) without abnormal findings: Secondary | ICD-10-CM | POA: Insufficient documentation

## 2022-10-24 MED ORDER — COMIRNATY 30 MCG/0.3ML IM SUSY
PREFILLED_SYRINGE | INTRAMUSCULAR | 0 refills | Status: DC
  Filled 2022-10-24: qty 0.3, 1d supply, fill #0

## 2022-10-24 MED ORDER — FLUARIX QUADRIVALENT 0.5 ML IM SUSY
PREFILLED_SYRINGE | INTRAMUSCULAR | 0 refills | Status: DC
  Filled 2022-10-24: qty 0.5, 1d supply, fill #0

## 2022-10-24 NOTE — Progress Notes (Signed)
Patient complaining of vaginal itching and white cottage cheese like discharge. Kerri Alu RN

## 2022-10-24 NOTE — Progress Notes (Signed)
Subjective:     Kerri Guerra is a 47 y.o. female here for a routine exam.  Current complaints: Pt reports an odor noted on yesterday. She uses feminine wipes to clean herself. She is sexually active. Uses condoms consistently.   Maternal g-mother with some type of cancer. G-father had prostate cancer.     Gynecologic History Patient's last menstrual period was 10/01/2022. Contraception: condoms Last Pap: 06/01/2021. Results were: abnormal;  Component 1 yr ago  High risk HPV Positive Abnormal   Neisseria Gonorrhea Negative  Chlamydia Negative  HPV 16 Negative  HPV 18 / 45 Negative  Adequacy Satisfactory for evaluation; transformation zone component PRESENT.  Diagnosis - Atypical squamous cells of undetermined significance (ASC-US) Abnormal   Comment Normal Reference Range HPV - Negative  Comment Normal Reference Ranger Chlamydia - Negative  Comment Normal Reference Range Neisseria Gonorrhea - Negative  Comment Normal Reference Range HPV 16 18 45 -Negative  S/p colpo 07/26/2021 FINAL MICROSCOPIC DIAGNOSIS:   A. CERVIX, 4 AND 9 O'CLOCK, BIOPSY:  -  Low grade squamous intraepithelial lesion (CIN1, mild dysplasia)   B. ENDOCERVIX, CURETTAGE:  -  Blood and mucoinflammatory debris with scant glandular cells with  tubal metaplasia  -  No malignancy identified    Last mammogram: 07/04/2021. Results were: normal  Obstetric History OB History  Gravida Para Term Preterm AB Living  1       1    SAB IAB Ectopic Multiple Live Births  1            # Outcome Date GA Lbr Len/2nd Weight Sex Delivery Anes PTL Lv  1 SAB 04/08/17 [redacted]w[redacted]d            The following portions of the patient's history were reviewed and updated as appropriate: allergies, current medications, past family history, past medical history, past social history, past surgical history, and problem list.  Review of Systems Pertinent items are noted in HPI.    Objective:  BP 121/77   Pulse 90   Ht 5\' 7"  (1.702 m)    Wt 236 lb (107 kg)   LMP 10/01/2022   BMI 36.96 kg/m   General Appearance:    Alert, cooperative, no distress, appears stated age  Head:    Normocephalic, without obvious abnormality, atraumatic  Eyes:    conjunctiva/corneas clear, EOM's intact, both eyes  Ears:    Normal external ear canals, both ears  Nose:   Nares normal, septum midline, mucosa normal, no drainage    or sinus tenderness  Throat:   Lips, mucosa, and tongue normal; teeth and gums normal  Neck:   Supple, symmetrical, trachea midline, no adenopathy;    thyroid:  no enlargement/tenderness/nodules  Back:     Symmetric, no curvature, ROM normal, no CVA tenderness  Lungs:     respirations unlabored  Chest Wall:    No tenderness or deformity   Heart:    Regular rate and rhythm  Breast Exam:    No tenderness, masses, or nipple abnormality  Abdomen:     Soft, non-tender, bowel sounds active all four quadrants,    no masses, no organomegaly  Genitalia:    Normal female without lesion, discharge or tenderness     Extremities:   Extremities normal, atraumatic, no cyanosis or edema  Pulses:   2+ and symmetric all extremities  Skin:   Skin color, texture, turgor normal, no rashes or lesions     Assessment:    Healthy female exam.    Plan:  Kerri Guerra was seen today for gynecologic exam.  Diagnoses and all orders for this visit:  Well female exam with routine gynecological exam -     Cytology - PAP( [facility])  Vaginal odor -     Cervicovaginal ancillary only( [facility])  Screening mammogram for breast cancer -     MM Digital Screening; Future   H/o abnormal PAP and +hrHPV.   F/u in 1 year or sooner prn   [name], M.D., Cherlynn June

## 2022-10-25 ENCOUNTER — Other Ambulatory Visit: Payer: Self-pay | Admitting: Obstetrics & Gynecology

## 2022-10-25 DIAGNOSIS — Z1231 Encounter for screening mammogram for malignant neoplasm of breast: Secondary | ICD-10-CM

## 2022-10-26 DIAGNOSIS — F411 Generalized anxiety disorder: Secondary | ICD-10-CM | POA: Diagnosis not present

## 2022-10-26 DIAGNOSIS — F329 Major depressive disorder, single episode, unspecified: Secondary | ICD-10-CM | POA: Diagnosis not present

## 2022-10-26 LAB — CERVICOVAGINAL ANCILLARY ONLY
Bacterial Vaginitis (gardnerella): NEGATIVE
Candida Glabrata: NEGATIVE
Candida Vaginitis: NEGATIVE
Chlamydia: NEGATIVE
Comment: NEGATIVE
Comment: NEGATIVE
Comment: NEGATIVE
Comment: NEGATIVE
Comment: NEGATIVE
Comment: NORMAL
Neisseria Gonorrhea: NEGATIVE
Trichomonas: NEGATIVE

## 2022-10-30 LAB — CYTOLOGY - PAP
Comment: NEGATIVE
Comment: NEGATIVE
Comment: NEGATIVE
HPV 16: NEGATIVE
HPV 18 / 45: NEGATIVE
High risk HPV: POSITIVE — AB

## 2022-11-07 ENCOUNTER — Telehealth: Payer: Self-pay

## 2022-11-07 NOTE — Telephone Encounter (Signed)
Called patient to inform her that her Pap smear shows High Risk HPV and LSIL and to schedule her for a Colpo.Left message for patient to call the office back. Elany Felix l Iris Tatsch, CMA

## 2022-11-07 NOTE — Telephone Encounter (Signed)
-----   Message from Carolyn Harraway-Smith, MD sent at 11/07/2022  3:25 PM EST ----- Please call pt. She needs a colpo.   Clh-S   

## 2022-11-08 ENCOUNTER — Telehealth: Payer: Self-pay

## 2022-11-08 NOTE — Telephone Encounter (Signed)
-----   Message from Lavonia Drafts, MD sent at 11/07/2022  3:25 PM EST ----- Please call pt. She needs a colpo.   Clh-S

## 2022-11-08 NOTE — Telephone Encounter (Signed)
Patient called and made aware of results and need for colposcopy. Patient states she is starting a new job and will get her new schedule next week. Patient to call back when she knows her work schedule. Kathrene Alu RN

## 2022-11-09 DIAGNOSIS — F411 Generalized anxiety disorder: Secondary | ICD-10-CM | POA: Diagnosis not present

## 2022-11-09 DIAGNOSIS — F329 Major depressive disorder, single episode, unspecified: Secondary | ICD-10-CM | POA: Diagnosis not present

## 2022-11-10 NOTE — Progress Notes (Signed)
Chief Complaint:   OBESITY Kerri Guerra is here to discuss her progress with her obesity treatment plan along with follow-up of her obesity related diagnoses. Kerri Guerra is on keeping a food journal and adhering to recommended goals of 1300 calories and 85 grams protein and states she is following her eating plan approximately 10% of the time. Kerri Guerra states she is going to the gym 60 minutes 1-2 times per week.  Today's visit was #: 7 Starting weight: 240 lbs Starting date: 05/15/2022 Today's weight: 230 lbs Today's date: 10/22/2022 Total lbs lost to date: 10 Total lbs lost since last in-office visit: +6  Interim History: Kerri Guerra has had more social outings with poor food choices and more ETOH intake. She has been eating more cookies and pizza. She skips meals, then makes bad choices. Pt is part of 2 social groups and is doing more reward eating.   Subjective:   1. Polyphagia Improving. Pt is taking Topamax 100 mg daily and it is helping with symptoms. She denies paresthesias.  2. Vitamin D deficiency Pt is taking prescription Vitamin D 50,000 IU weekly. Her last Vitamin D level was 32 on 05/15/2022.  3. Other depression with emotional eating Improving. Pt is taking Wellbutrin XL 150 mg every morning, and it is helping to reduce stress eating, but she is still using food as reward.  Assessment/Plan:   1. Polyphagia Continue current treatment plan.  Refill- topiramate (TOPAMAX) 100 MG tablet; Take 1 tablet (100 mg total) by mouth daily.  Dispense: 30 tablet; Refill: 0  2. Vitamin D deficiency Continue current treatment plan. Recheck level in 4 weeks.  Refill- Vitamin D, Ergocalciferol, (DRISDOL) 1.25 MG (50000 UNIT) CAPS capsule; Take 1 capsule (50,000 Units total) by mouth every 7 (seven) days.  Dispense: 5 capsule; Refill: 0  3. Other depression with emotional eating Continue current treatment plan.  Refill- buPROPion (WELLBUTRIN XL) 150 MG 24 hr tablet; Take 1 tablet (150  mg total) by mouth daily.  Dispense: 30 tablet; Refill: 0  4. Obesity,current BMI 35.0 Resume gym workouts 3 times a week. Resume category 3 meal plan. Discussed pre-made meals.  Kerri Guerra is currently in the action stage of change. As such, her goal is to continue with weight loss efforts. She has agreed to the Category 3 Plan and keeping a food journal and adhering to recommended goals of 700 calories and 20+ grams protein with supper.   Exercise goals:  Increase gym time to 2-3 days a week.  Behavioral modification strategies: increasing lean protein intake, decreasing simple carbohydrates, increasing vegetables, increasing water intake, decreasing alcohol intake, decreasing eating out, meal planning and cooking strategies, celebration eating strategies, and avoiding temptations.  Wednesday has agreed to follow-up with our clinic in 4 weeks. She was informed of the importance of frequent follow-up visits to maximize her success with intensive lifestyle modifications for her multiple health conditions.   Objective:   Blood pressure 122/84, pulse 74, temperature 98.6 F (37 C), height 5' 8"$  (1.727 m), weight 230 lb (104.3 kg), SpO2 100 %. Body mass index is 34.97 kg/m.  General: Cooperative, alert, well developed, in no acute distress. HEENT: Conjunctivae and lids unremarkable. Cardiovascular: Regular rhythm.  Lungs: Normal work of breathing. Neurologic: No focal deficits.   Lab Results  Component Value Date   CREATININE 0.95 11/19/2022   BUN 15 11/19/2022   NA 138 11/19/2022   K 4.6 11/19/2022   CL 104 11/19/2022   CO2 20 11/19/2022   Lab Results  Component Value Date   ALT 14 11/19/2022   AST 14 11/19/2022   ALKPHOS 77 11/19/2022   BILITOT <0.2 11/19/2022   Lab Results  Component Value Date   HGBA1C 5.3 11/19/2022   HGBA1C 5.6 05/15/2022   Lab Results  Component Value Date   INSULIN 14.3 05/15/2022   Lab Results  Component Value Date   TSH 2.670 05/15/2022    Lab Results  Component Value Date   CHOL 148 05/15/2022   HDL 42 05/15/2022   LDLCALC 91 05/15/2022   TRIG 77 05/15/2022   CHOLHDL 3.5 05/15/2022   Lab Results  Component Value Date   VD25OH 45.4 11/19/2022   VD25OH 37.88 05/15/2018   VD25OH 21.89 (L) 02/12/2018   Lab Results  Component Value Date   WBC 4.1 01/26/2022   HGB 13.0 01/26/2022   HCT 39.5 01/26/2022   MCV 88.6 01/26/2022   PLT 314.0 01/26/2022   Lab Results  Component Value Date   FERRITIN 80 05/15/2022   Attestation Statements:   Reviewed by clinician on day of visit: allergies, medications, problem list, medical history, surgical history, family history, social history, and previous encounter notes.  I have personally spent 30 minutes total time today in preparation, patient care, nutritional counseling and documentation for this visit, including the following: review of clinical lab tests; review of medical tests/procedures/services.    I, Kathlene November, BS, CMA, am acting as transcriptionist for Loyal Gambler, DO.   I have reviewed the above documentation for accuracy and completeness, and I agree with the above. Dell Ponto, DO

## 2022-11-16 DIAGNOSIS — F411 Generalized anxiety disorder: Secondary | ICD-10-CM | POA: Diagnosis not present

## 2022-11-16 DIAGNOSIS — F329 Major depressive disorder, single episode, unspecified: Secondary | ICD-10-CM | POA: Diagnosis not present

## 2022-11-19 ENCOUNTER — Encounter (INDEPENDENT_AMBULATORY_CARE_PROVIDER_SITE_OTHER): Payer: Self-pay | Admitting: Family Medicine

## 2022-11-19 ENCOUNTER — Ambulatory Visit (INDEPENDENT_AMBULATORY_CARE_PROVIDER_SITE_OTHER): Payer: BC Managed Care – PPO | Admitting: Family Medicine

## 2022-11-19 VITALS — BP 135/77 | HR 75 | Temp 98.8°F | Ht 68.0 in | Wt 233.0 lb

## 2022-11-19 DIAGNOSIS — F3289 Other specified depressive episodes: Secondary | ICD-10-CM | POA: Diagnosis not present

## 2022-11-19 DIAGNOSIS — R632 Polyphagia: Secondary | ICD-10-CM

## 2022-11-19 DIAGNOSIS — E559 Vitamin D deficiency, unspecified: Secondary | ICD-10-CM

## 2022-11-19 DIAGNOSIS — Z6835 Body mass index (BMI) 35.0-35.9, adult: Secondary | ICD-10-CM

## 2022-11-19 MED ORDER — BUPROPION HCL ER (XL) 150 MG PO TB24: 150.0000 mg | ORAL_TABLET | Freq: Every day | ORAL | 0 refills | Status: DC

## 2022-11-19 MED ORDER — TOPIRAMATE 100 MG PO TABS: 100.0000 mg | ORAL_TABLET | Freq: Every day | ORAL | 0 refills | Status: DC

## 2022-11-19 MED ORDER — VITAMIN D (ERGOCALCIFEROL) 1.25 MG (50000 UNIT) PO CAPS: 50000.0000 [IU] | ORAL_CAPSULE | ORAL | 0 refills | Status: DC

## 2022-11-19 NOTE — Assessment & Plan Note (Signed)
Last vitamin D Lab Results  Component Value Date   VD25OH 37.88 05/15/2018     Taking prescription vitamin D 50,000 IU once weekly.  Last vitamin D level 32 8/15 with a target 50-70.  Energy level is improving.  Due for recheck vitamin D level

## 2022-11-19 NOTE — Assessment & Plan Note (Signed)
Improving on topiramate 100 mg once daily. Practicing mindful eating Has improved eating on a schedule, lean protein and fiber with meals.

## 2022-11-19 NOTE — Assessment & Plan Note (Signed)
Depression with emotional eating improving on Wellbutrin XL 150 mg once daily.  Denies adverse side effects.  Has a good support system.  Has been trying to keep trigger foods out of the house.

## 2022-11-19 NOTE — Progress Notes (Signed)
Office: [phone]  /  Fax: [phone]  WEIGHT SUMMARY AND BIOMETRICS  Medical Weight Loss Height: 5' 8"$  (1.727 m) Weight: 233 lb (105.7 kg) Temp: 98.8 F (37.1 C) Pulse Rate: 75 BP: 135/77 SpO2: 100 % Fasting: no Labs: no Today's Visit #: 8 Weight at Last VIsit: 230lb Weight Lost Since Last Visit: +3  Body Fat %: 41.2 % Fat Mass (lbs): 96 lbs Muscle Mass (lbs): 130.2 lbs Total Body Water (lbs): 92 lbs Visceral Fat Rating : 10 Starting Date: 05/15/22 Starting Weight: 240lb Total Weight Loss (lbs): 7 lb (3.175 kg)    HPI  Chief Complaint: OBESITY  Kerri Guerra is here to discuss her progress with her obesity treatment plan. She is on the the Category 3 Plan and states she is following her eating plan approximately 50 % of the time. She states she is exercising 30 minutes 3 times per week.   Interval History:  Since last office visit she up 3 lb since last visit. Under financial stress- starting a new job tomorrow. Eating more raisin bran and salads (eating some off plan) Denies much snacking.  Her new job will be a hybrid position. Looking forward to a routine with new job Doing some dog walking   Pharmacotherapy: Topiramate  PHYSICAL EXAM:  Blood pressure 135/77, pulse 75, temperature 98.8 F (37.1 C), height 5' 8"$  (1.727 m), weight 233 lb (105.7 kg), last menstrual period 10/01/2022, SpO2 100 %. Body mass index is 35.43 kg/m.  General: She is overweight, cooperative, alert, well developed, and in no acute distress. PSYCH: Has normal mood, affect and thought process.   HEENT: EOMI, sclerae are anicteric. Lungs: Normal breathing effort, no conversational dyspnea. Extremities: No edema.  Neurologic: No gross sensory or motor deficits. No tremors or fasciculations noted.    DIAGNOSTIC DATA REVIEWED:  BMET    Component Value Date/Time   NA 139 01/26/2022 1316   K 4.2 01/26/2022 1316   CL 109 01/26/2022 1316   CO2 25 01/26/2022 1316   GLUCOSE 94  01/26/2022 1316   BUN 16 01/26/2022 1316   CREATININE 1.18 01/26/2022 1316   CREATININE 1.06 11/15/2017 1638   CALCIUM 8.7 01/26/2022 1316   Lab Results  Component Value Date   HGBA1C 5.6 05/15/2022   Lab Results  Component Value Date   INSULIN 14.3 05/15/2022   Lab Results  Component Value Date   TSH 2.670 05/15/2022   CBC    Component Value Date/Time   WBC 4.1 01/26/2022 1316   RBC 4.46 01/26/2022 1316   HGB 13.0 01/26/2022 1316   HCT 39.5 01/26/2022 1316   PLT 314.0 01/26/2022 1316   MCV 88.6 01/26/2022 1316   MCH 28.1 11/15/2017 1638   MCHC 32.9 01/26/2022 1316   RDW 14.2 01/26/2022 1316   Iron Studies    Component Value Date/Time   FERRITIN 80 05/15/2022 0843   Lipid Panel     Component Value Date/Time   CHOL 148 05/15/2022 0843   TRIG 77 05/15/2022 0843   HDL 42 05/15/2022 0843   CHOLHDL 3.5 05/15/2022 0843   CHOLHDL 3 01/26/2022 1316   VLDL 11.6 01/26/2022 1316   LDLCALC 91 05/15/2022 0843   Hepatic Function Panel     Component Value Date/Time   PROT 7.1 01/26/2022 1316   ALBUMIN 4.1 01/26/2022 1316   AST 16 01/26/2022 1316   ALT 14 01/26/2022 1316   ALKPHOS 59 01/26/2022 1316   BILITOT 0.3 01/26/2022 1316      Component Value  Date/Time   TSH 2.670 05/15/2022 0843   TSH 1.31 11/15/2017 1638   Nutritional Lab Results  Component Value Date   VD25OH 37.88 05/15/2018   VD25OH 21.89 (L) 02/12/2018   VD25OH 22 (L) 11/15/2017     ASSESSMENT AND PLAN  TREATMENT PLAN FOR OBESITY:  Recommended Dietary Goals  Christyn is currently in the action stage of change. As such, her goal is to continue weight management plan. She has agreed to the Category 2 Plan.  Behavioral Intervention  We discussed the following Behavioral Modification Strategies today: increasing lean protein intake, increasing vegetables, increase water intake, work on meal planning and easy cooking plans, and think about ways to increase physical activity.  Additional  resources provided today: NA  Recommended Physical Activity Goals  Taneisha has been advised to work up to 150 minutes of moderate intensity aerobic activity a week and strengthening exercises 2-3 times per week for cardiovascular health, weight loss maintenance and preservation of muscle mass.   She has agreed to increase physical activity in their day and reduce sedentary time (increase NEAT).    Pharmacotherapy We discussed various medication options to help Levette with her weight loss efforts and we both agreed to continuing Topiramate..  ASSOCIATED CONDITIONS ADDRESSED TODAY  Polyphagia Assessment & Plan: Improving on topiramate 100 mg once daily. Practicing mindful eating Has improved eating on a schedule, lean protein and fiber with meals.  Orders: -     Topiramate; Take 1 tablet (100 mg total) by mouth daily.  Dispense: 30 tablet; Refill: 0 -     Hemoglobin A1c -     Comprehensive metabolic panel  Morbid obesity (HCC)  Vitamin D deficiency Assessment & Plan: Last vitamin D Lab Results  Component Value Date   VD25OH 37.88 05/15/2018    Recheck vitamin D level today Refilled RX vitamin D 50,000 IU weekly  Orders: -     Vitamin D (Ergocalciferol); Take 1 capsule (50,000 Units total) by mouth every 7 (seven) days.  Dispense: 5 capsule; Refill: 0  Other depression with emotional eating Assessment & Plan: Depression with emotional eating improving on Wellbutrin XL 150 mg once daily.  Denies adverse side effects.  Has a good support system.  Has been trying to keep trigger foods out of the house.  Orders: -     buPROPion HCl ER (XL); Take 1 tablet (150 mg total) by mouth daily.  Dispense: 30 tablet; Refill: 0  Vitamin D insufficiency Assessment & Plan: Last vitamin D Lab Results  Component Value Date   VD25OH 37.88 05/15/2018     Taking prescription vitamin D 50,000 IU once weekly.  Last vitamin D level 32 8/15 with a target 50-70.  Energy level is  improving.  Due for recheck vitamin D level  Orders: -     VITAMIN D 25 Hydroxy (Vit-D Deficiency, Fractures)  BMI 35.0-35.9,adult      No follow-ups on file.Marland Kitchen She was informed of the importance of frequent follow up visits to maximize her success with intensive lifestyle modifications for her multiple health conditions.   ATTESTASTION STATEMENTS:  Reviewed by clinician on day of visit: allergies, medications, problem list, medical history, surgical history, family history, social history, and previous encounter notes.   I have personally spent 30 minutes total time today in preparation, patient care, nutritional counseling and documentation for this visit, including the following: review of clinical lab tests; review of medical tests/procedures/services.      Dell Ponto, DO

## 2022-11-19 NOTE — Assessment & Plan Note (Signed)
Last vitamin D Lab Results  Component Value Date   VD25OH 37.88 05/15/2018    Recheck vitamin D level today Refilled RX vitamin D 50,000 IU weekly

## 2022-11-20 DIAGNOSIS — E669 Obesity, unspecified: Secondary | ICD-10-CM | POA: Insufficient documentation

## 2022-11-20 LAB — COMPREHENSIVE METABOLIC PANEL
ALT: 14 IU/L (ref 0–32)
AST: 14 IU/L (ref 0–40)
Albumin/Globulin Ratio: 1.3 (ref 1.2–2.2)
Albumin: 3.9 g/dL (ref 3.9–4.9)
Alkaline Phosphatase: 77 IU/L (ref 44–121)
BUN/Creatinine Ratio: 16 (ref 9–23)
BUN: 15 mg/dL (ref 6–24)
Bilirubin Total: <0.2 mg/dL (ref 0.0–1.2)
CO2: 20 mmol/L (ref 20–29)
Calcium: 9.1 mg/dL (ref 8.7–10.2)
Chloride: 104 mmol/L (ref 96–106)
Creatinine, Ser: 0.95 mg/dL (ref 0.57–1.00)
Globulin, Total: 2.9 g/dL (ref 1.5–4.5)
Glucose: 96 mg/dL (ref 70–99)
Potassium: 4.6 mmol/L (ref 3.5–5.2)
Sodium: 138 mmol/L (ref 134–144)
Total Protein: 6.8 g/dL (ref 6.0–8.5)
eGFR: 76 mL/min/{1.73_m2} (ref >59)

## 2022-11-20 LAB — HEMOGLOBIN A1C
Est. average glucose Bld gHb Est-mCnc: 105 mg/dL
Hgb A1c MFr Bld: 5.3 % (ref 4.8–5.6)

## 2022-11-20 LAB — VITAMIN D 25 HYDROXY (VIT D DEFICIENCY, FRACTURES): Vit D, 25-Hydroxy: 45.4 ng/mL (ref 30.0–100.0)

## 2022-11-30 ENCOUNTER — Encounter: Payer: Self-pay | Admitting: Obstetrics & Gynecology

## 2022-12-03 ENCOUNTER — Other Ambulatory Visit (HOSPITAL_BASED_OUTPATIENT_CLINIC_OR_DEPARTMENT_OTHER): Payer: Self-pay

## 2022-12-03 ENCOUNTER — Other Ambulatory Visit: Payer: Self-pay | Admitting: Obstetrics & Gynecology

## 2022-12-03 DIAGNOSIS — Z30011 Encounter for initial prescription of contraceptive pills: Secondary | ICD-10-CM

## 2022-12-03 MED ORDER — NORETHINDRONE ACET-ETHINYL EST 1-20 MG-MCG PO TABS
1.0000 | ORAL_TABLET | Freq: Every day | ORAL | 11 refills | Status: DC
  Filled 2022-12-03: qty 21, 21d supply, fill #0
  Filled 2022-12-19: qty 21, 21d supply, fill #1
  Filled 2023-01-09: qty 21, 21d supply, fill #2
  Filled 2023-01-28: qty 21, 21d supply, fill #3
  Filled 2023-02-15: qty 21, 21d supply, fill #4
  Filled 2023-03-11: qty 21, 21d supply, fill #5
  Filled 2023-04-16: qty 21, 21d supply, fill #6
  Filled 2023-05-03: qty 21, 21d supply, fill #7
  Filled 2023-05-24: qty 21, 21d supply, fill #8
  Filled 2023-06-14: qty 21, 21d supply, fill #9
  Filled 2023-07-01: qty 21, 21d supply, fill #10
  Filled 2023-07-25: qty 21, 21d supply, fill #11

## 2022-12-13 DIAGNOSIS — Z1231 Encounter for screening mammogram for malignant neoplasm of breast: Secondary | ICD-10-CM

## 2022-12-13 NOTE — Progress Notes (Signed)
TeleHealth Visit:  This visit was completed with telemedicine (audio/video) technology. Kerri Guerra has verbally consented to this TeleHealth visit. The patient is located at home, the provider is located at home. The participants in this visit include the listed provider and patient. The visit was conducted today via MyChart video.  OBESITY Kerri Guerra is here to discuss her progress with her obesity treatment plan along with follow-up of her obesity related diagnoses.   Today's visit was # 9 Starting weight: 240 lbs Starting date: 05/15/22 Weight at last in office visit: 233 lbs on 11/19/22 Total weight loss: 7 lbs at last in office visit on 11/19/22. Today's reported weight:  241 lbs  Nutrition Plan: the Category 2 plan - 0% adherence.  Current exercise:  30 minutes 1 time per week  Interim History:  She is struggling with sweets cravings. Shje ate a whole pie the other day throughout the day.  She also had a day when she ate a box of ice cream. She skips breakfast and lunch and then sometimes finds it hard to control herself in the evening.  She orders Door Deliah Boston most evenings and admits she feels like it is a "reward" for a hard days work. She does not cook. She has a new job and travels once weekly to [place] to [employer] each way.  Goes the night before and then leaves the next evening after work.  The morning she is in [place] is the only day that she eats breakfast.  She picks up an egg and steak McMuffin. Meal skipping has been a longstanding habit for her. She says she knows that she will not eat breakfast but will occasionally have a yogurt.  Has part of a protein shake in her coffee. She stopped taking the topiramate so she would eat more during the day.  Eating all of the prescribed protein: no Skipping meals: Yes  She would like to alternate in person and virtual visits. Assessment/Plan:  We discussed recent lab results in depth.  1. Vitamin D  Deficiency Vitamin D is not at goal of 50.  Most recent vitamin D level was 45.4 on 11/19/2022.Marland Kitchen She is on  prescription ergocalciferol 50,000 IU weekly. Lab Results  Component Value Date   VD25OH 45.4 11/19/2022   VD25OH 37.88 05/15/2018   VD25OH 21.89 (L) 02/12/2018    Plan: Refill prescription vitamin D 50,000 IU weekly.   2. Eating disorder/emotional eating Kerri Guerra has had issues with stress/emotional eating.  Tends to do some reward eating in the evening by ordering door Dash.  Also will overindulge on sweets. Currently this is poorly controlled.  Medication(s): Bupropion XL 150-minute milligrams daily, topiramate 100 mg daily (not currently taking)  Plan: Continue bupropion XL 150 mg daily. Restart topiramate and reduce dose to 50 mg daily.  3. Generalized Obesity: Current BMI 35 [name] is currently in the action stage of change. As such, her goal is to continue with weight loss efforts.  She has agreed to practicing portion control and making smarter food choices, such as increasing vegetables and decreasing simple carbohydrates.  1.  Have a Ratio yogurt for breakfast. 2.  Have protein shake or protein bar with at least 20 g of protein for lunch. 3.  Discussed meal ideas for dinner that do not require cooking (special K cereal with fair life milk, scrambled eggs, canned soup with protein plus grilled cheese). 4.  Reduce use of Door Dash. 5.  Discussed that she will likely be unsuccessful with weight loss if she  continues to skip meals throughout the day.  Exercise goals: All adults should avoid inactivity. Some physical activity is better than none, and adults who participate in any amount of physical activity gain some health benefits.  Behavioral modification strategies: increasing lean protein intake, decreasing simple carbohydrates , no meal skipping, meal planning , and planning for success.  Kerri Guerra has agreed to follow-up with our clinic in 4 weeks.   No orders of  the defined types were placed in this encounter.   Medications Discontinued During This Encounter  Medication Reason   Vitamin D, Ergocalciferol, (DRISDOL) 1.25 MG (50000 UNIT) CAPS capsule Reorder     Meds ordered this encounter  Medications   Vitamin D, Ergocalciferol, (DRISDOL) 1.25 MG (50000 UNIT) CAPS capsule    Sig: Take 1 capsule (50,000 Units total) by mouth every 7 (seven) days.    Dispense:  12 capsule    Refill:  0    Order Specific Question:   Supervising Provider    Answer:   Dell Ponto [[id number]]      Objective:   VITALS: Per patient if applicable, see vitals. GENERAL: Alert and in no acute distress. CARDIOPULMONARY: No increased WOB. Speaking in clear sentences.  PSYCH: Pleasant and cooperative. Speech normal rate and rhythm. Affect is appropriate. Insight and judgement are appropriate. Attention is focused, linear, and appropriate.  NEURO: Oriented as arrived to appointment on time with no prompting.   Attestation Statements:   Reviewed by clinician on day of visit: allergies, medications, problem list, medical history, surgical history, family history, social history, and previous encounter notes.  This was prepared with the assistance of Presenter, broadcasting.  Occasional wrong-word or sound-a-like substitutions may have occurred due to the inherent limitations of voice recognition software.

## 2022-12-17 ENCOUNTER — Telehealth (INDEPENDENT_AMBULATORY_CARE_PROVIDER_SITE_OTHER): Payer: 59 | Admitting: Family Medicine

## 2022-12-17 ENCOUNTER — Encounter (INDEPENDENT_AMBULATORY_CARE_PROVIDER_SITE_OTHER): Payer: Self-pay | Admitting: Family Medicine

## 2022-12-17 DIAGNOSIS — F5089 Other specified eating disorder: Secondary | ICD-10-CM

## 2022-12-17 DIAGNOSIS — E559 Vitamin D deficiency, unspecified: Secondary | ICD-10-CM

## 2022-12-17 DIAGNOSIS — E669 Obesity, unspecified: Secondary | ICD-10-CM

## 2022-12-17 DIAGNOSIS — Z6835 Body mass index (BMI) 35.0-35.9, adult: Secondary | ICD-10-CM | POA: Diagnosis not present

## 2022-12-17 MED ORDER — VITAMIN D (ERGOCALCIFEROL) 1.25 MG (50000 UNIT) PO CAPS: 50000.0000 [IU] | ORAL_CAPSULE | ORAL | 0 refills | Status: DC

## 2023-01-17 ENCOUNTER — Telehealth: Payer: Self-pay

## 2023-01-17 ENCOUNTER — Ambulatory Visit (INDEPENDENT_AMBULATORY_CARE_PROVIDER_SITE_OTHER): Payer: 59 | Admitting: Family Medicine

## 2023-01-17 DIAGNOSIS — K5909 Other constipation: Secondary | ICD-10-CM

## 2023-01-17 NOTE — Telephone Encounter (Signed)
PA initiated via Covermymeds; KEY: [id number]. Awaiting determination.

## 2023-01-17 NOTE — Telephone Encounter (Signed)
Optimization team for [employer] [facility] stated on the PA it said she tried [place] but they wanted to know if that was the generic version or not. Call back is [phone]

## 2023-01-18 ENCOUNTER — Other Ambulatory Visit: Payer: Self-pay | Admitting: Family Medicine

## 2023-01-18 DIAGNOSIS — K5909 Other constipation: Secondary | ICD-10-CM

## 2023-01-18 MED ORDER — LUBIPROSTONE 24 MCG PO CAPS: 24.0000 ug | ORAL_CAPSULE | Freq: Two times a day (BID) | ORAL | 2 refills | Status: DC

## 2023-01-18 NOTE — Telephone Encounter (Signed)
PA denied. Awaiting denial information.   Request Reference Number: [id number]. TRULANCE TAB  is denied for not meeting the prior authorization requirement(s). Details of this decision are in the notice attached below or have been faxed to you.

## 2023-01-18 NOTE — Telephone Encounter (Signed)
The medicine is covered only if patient has a history of failure, contraindication or intolerance to one of the following. 1--Lubiprostone (generic Amitiza) 2--Motegrity The information provided does not show that she has meet the criteria listed above.

## 2023-01-18 NOTE — Telephone Encounter (Signed)
Called left message to call back 

## 2023-01-18 NOTE — Telephone Encounter (Signed)
Generic

## 2023-01-21 ENCOUNTER — Telehealth: Payer: Self-pay

## 2023-01-21 NOTE — Telephone Encounter (Signed)
PA denied. Awaiting denial information.   Request Reference Number: [id number]. LUBIPROSTONE CAP is denied for not meeting the prior authorization requirement(s). Details of this decision are in the notice attached below or have been faxed to you

## 2023-01-21 NOTE — Telephone Encounter (Signed)
I don't see the reasoning and the reasoning for not approving Trulance was because she has not failed generic lubiprostone.

## 2023-01-21 NOTE — Telephone Encounter (Signed)
PA initiated via Covermymeds; KEY:  [id number]. Awaiting determination.

## 2023-01-28 ENCOUNTER — Encounter: Payer: Self-pay | Admitting: Family Medicine

## 2023-01-28 MED ORDER — PLECANATIDE 3 MG PO TABS: 3.0000 mg | ORAL_TABLET | Freq: Every day | ORAL | 2 refills | Status: DC

## 2023-01-28 NOTE — Telephone Encounter (Signed)
Called left message to call back 

## 2023-01-28 NOTE — Telephone Encounter (Signed)
Is there an alternative to this?  

## 2023-01-28 NOTE — Telephone Encounter (Signed)
Alt sent.  

## 2023-01-28 NOTE — Addendum Note (Signed)
Addended by: Radene Gunning on: 01/28/2023 12:44 PM   Modules accepted: Orders

## 2023-01-29 ENCOUNTER — Other Ambulatory Visit: Payer: Self-pay | Admitting: Family Medicine

## 2023-02-08 ENCOUNTER — Encounter: Payer: Self-pay | Admitting: Family Medicine

## 2023-02-08 ENCOUNTER — Telehealth (INDEPENDENT_AMBULATORY_CARE_PROVIDER_SITE_OTHER): Payer: BC Managed Care – PPO | Admitting: Family Medicine

## 2023-02-08 DIAGNOSIS — K5904 Chronic idiopathic constipation: Secondary | ICD-10-CM

## 2023-02-08 MED ORDER — MOTEGRITY 2 MG PO TABS: 2.0000 mg | ORAL_TABLET | Freq: Every day | ORAL | 5 refills | Status: DC

## 2023-02-08 NOTE — Progress Notes (Signed)
Chief Complaint  Patient presents with   Medication Management    Subjective: Patient is a 47 y.o. female here for f/u. We are interacting via web portal for an electronic face-to-face visit. I verified patient's ID using 2 identifiers. Patient agreed to proceed with visit via this method. Patient is at home, I am at office. Patient and I are present for visit.     Past Medical History:  Diagnosis Date   Anxiety with depression 07/01/2017   Fibromyalgia    Vaginal Pap smear, abnormal     Objective: No conversational dyspnea Age appropriate judgment and insight Nml affect and mood  Assessment and Plan: Chronic idiopathic constipation - Plan: Prucalopride Succinate (MOTEGRITY) 2 MG TABS, Ambulatory referral to Gastroenterology  Chronic, not stable. Have been unable to get meds covered. Start Motegrity 2 mg/d. Refer GI for their help if not covered. Stay hydrated. Will send constipation cocktail via MyChart.  The patient voiced understanding and agreement to the plan.  Jilda Roche [place], DO 02/08/23  2:48 PM

## 2023-02-11 ENCOUNTER — Telehealth: Payer: Self-pay

## 2023-02-11 DIAGNOSIS — K5909 Other constipation: Secondary | ICD-10-CM

## 2023-02-11 NOTE — Telephone Encounter (Signed)
PA initiated via Covermymeds; KEY: [id number]. Awaiting determination.

## 2023-02-12 ENCOUNTER — Other Ambulatory Visit (HOSPITAL_BASED_OUTPATIENT_CLINIC_OR_DEPARTMENT_OTHER): Payer: Self-pay

## 2023-02-12 MED ORDER — LINACLOTIDE 145 MCG PO CAPS
145.0000 ug | ORAL_CAPSULE | Freq: Every day | ORAL | 1 refills | Status: DC
  Filled 2023-02-12: qty 30, 30d supply, fill #0

## 2023-02-12 NOTE — Telephone Encounter (Signed)
Called the patient informed of PCP response/instructions. She will call [facility] [phone]) to get the appt scheduled.  She currently has not heard from them

## 2023-02-12 NOTE — Telephone Encounter (Signed)
PA denied. Preferred drugs listed below, however they were both denied previously. I'm unsure how to get this approved for Pt.   The preferred drugs for your plan are: lubiprostone, LINZESS  Maybe provider can call insurance directly to discuss with them?     HERE'S WHAT YOU CAN DO Below are steps that you can take now that your request for coverage has been denied. Read through your options and decide which is best for you. If you are unsure of what you should do next, you may wish to talk to your doctor.  Submit another request with the necessary clinical information If you still want to request coverage for this medication, you or your prescriber can send another request with the necessary clinical information to: [facility] [employer] Prior Authorization [facility]) [address] [place], [place] [address] Phone: [phone] Fax: [phone]  Continue taking this medication without further action You can continue filling prescriptions for this medication; but without an approved request for coverage, it won't be covered by your plan, and you will have to pay the full cost.  Talk to your doctor about alternative medications There may be alternative medications that are available under your prescription plan. Talk to your doctor about whether one of these options might work for you. Please note that alternative medications may also require a request for coverage or prior authorization.  Appeal the decision If you don't agree with this decision, you, your doctor or your authorized representative have the right to ask for an appeal. The appeal request should be in writing and show why you or your doctor thinks this medication needs to be covered for you. Examples of documents you can include are a letter from your doctor, clinical notes, or test results. If your situation is urgent, you can ask for an expedited appeal by phone or in writing. Be sure to clearly mark any appeal  documents as "urgent" when you send them.

## 2023-02-12 NOTE — Telephone Encounter (Signed)
Called and spoke with them. Linzess and lubpriprostone is reportedly covered without requirements for a PA. Coincidentally that is what she did the best with historically. Please inform pt. Would not cancel GI referral just yet though. Ty.

## 2023-02-13 ENCOUNTER — Other Ambulatory Visit (HOSPITAL_COMMUNITY): Payer: Self-pay

## 2023-02-13 NOTE — Telephone Encounter (Signed)
  Pharmacy Patient Advocate Encounter  Received notification from [facility] that the request for prior authorization for MOTEGRITY has been denied due to THAT YOU HAVE TRIED OTHER DRUGS THAT ARE COVERED (PREFERRED DRUGS)  LETTER OF DENIAL WILL BE SCANNED INTO MEDIA OF CHART.    Please be advised we currently do not have a Pharmacist to review denials, therefore you will need to process appeals accordingly as needed. Thanks for your support at this time.   You may call [phone] or fax [phone], to appeal.

## 2023-02-13 NOTE — Progress Notes (Unsigned)
  TeleHealth Visit:  This visit was completed with telemedicine (audio/video) technology. Sharifa has verbally consented to this TeleHealth visit. The patient is located at home, the provider is located at home. The participants in this visit include the listed provider and patient. The visit was conducted today via MyChart video.  OBESITY Larri is here to discuss her progress with her obesity treatment plan along with follow-up of her obesity related diagnoses.   Today's visit was # 10 Starting weight: 240 lbs Starting date: 05/15/22 Weight at last in office visit: 233 lbs on 11/19/22 Total weight loss: 7 lbs at last in office visit on 11/19/22. Today's reported weight (***): {dwwweightreported:29243}  Nutrition Plan: practicing portion control and making smarter food choices, such as increasing vegetables and decreasing simple carbohydrates - ***% adherence.  Current exercise: {exercise types:16438} 30 minutes 1 time per week  Interim History:  ***  Eating all of the prescribed protein: {yes***/no:17258} Skipping meals: {dwwyes:29172} Drinking adequate water: {dwwyes:29172} Drinking sugar sweetened beverages: {dwwyes:29172} Hunger controlled: {EWCONTROLASSESSMENT:24261}. Cravings controlled:  {EWCONTROLASSESSMENT:24261}.  Journaling Consistently:  {dwwyes:29172} Meeting protein goals:  {dwwyes:29172} Meeting calorie goals:  {dwwyes:29172}   Pharmacotherapy: Keyshla is on {dwwpharmacotherapy:[address]109} Adverse side effects: {dwwse:29122} Hunger is {EWCONTROLASSESSMENT:24261}.  Cravings are {EWCONTROLASSESSMENT:24261}.  Assessment/Plan:  1. ***  2. ***  3. ***  {dwwmorbid:29108::"Morbid Obesity"}: Current BMI ***  Pharmacotherapy Plan {dwwmed:29123}  {dwwpharmacotherapy:29109}  Sinaya {CHL AMB IS/IS NOT:210130109} currently in the action stage of change. As such, her goal is to {MWMwtloss#1:210800005}.  She has agreed to {dwwsldiets:29085}.  Exercise goals:  {MWM EXERCISE RECS:23473}  Behavioral modification strategies: {dwwslwtlossstrategies:29088}.  Christien has agreed to follow-up with our clinic in {NUMBER 1-10:22536} weeks.   No orders of the defined types were placed in this encounter.   There are no discontinued medications.   No orders of the defined types were placed in this encounter.     Objective:   VITALS: Per patient if applicable, see vitals. GENERAL: Alert and in no acute distress. CARDIOPULMONARY: No increased WOB. Speaking in clear sentences.  PSYCH: Pleasant and cooperative. Speech normal rate and rhythm. Affect is appropriate. Insight and judgement are appropriate. Attention is focused, linear, and appropriate.  NEURO: Oriented as arrived to appointment on time with no prompting.   Attestation Statements:   Reviewed by clinician on day of visit: allergies, medications, problem list, medical history, surgical history, family history, social history, and previous encounter notes.  ***(delete if time-based billing not used) Time spent on visit including the items listed below was *** minutes.  -preparing to see the patient (e.g., review of tests, history, previous notes) -obtaining and/or reviewing separately obtained history -counseling and educating the patient/family/caregiver -documenting clinical information in the electronic or other health record -ordering medications, tests, or procedures -independently interpreting results and communicating results to the patient/ family/caregiver -referring and communicating with other health care professionals  -care coordination   This was prepared with the assistance of Engineer, civil (consulting).  Occasional wrong-word or sound-a-like substitutions may have occurred due to the inherent limitations of voice recognition software.

## 2023-02-14 ENCOUNTER — Telehealth (INDEPENDENT_AMBULATORY_CARE_PROVIDER_SITE_OTHER): Payer: 59 | Admitting: Family Medicine

## 2023-02-14 ENCOUNTER — Encounter (INDEPENDENT_AMBULATORY_CARE_PROVIDER_SITE_OTHER): Payer: Self-pay | Admitting: Family Medicine

## 2023-02-14 VITALS — Ht 68.0 in | Wt 243.0 lb

## 2023-02-14 DIAGNOSIS — E669 Obesity, unspecified: Secondary | ICD-10-CM | POA: Diagnosis not present

## 2023-02-14 DIAGNOSIS — F3289 Other specified depressive episodes: Secondary | ICD-10-CM | POA: Diagnosis not present

## 2023-02-14 DIAGNOSIS — E559 Vitamin D deficiency, unspecified: Secondary | ICD-10-CM | POA: Diagnosis not present

## 2023-02-14 DIAGNOSIS — Z6836 Body mass index (BMI) 36.0-36.9, adult: Secondary | ICD-10-CM

## 2023-02-14 DIAGNOSIS — Z6835 Body mass index (BMI) 35.0-35.9, adult: Secondary | ICD-10-CM

## 2023-02-14 MED ORDER — BUPROPION HCL ER (XL) 150 MG PO TB24
150.0000 mg | ORAL_TABLET | Freq: Every day | ORAL | 0 refills | Status: DC
Start: 2023-02-14 — End: 2023-05-09

## 2023-02-14 MED ORDER — VITAMIN D (ERGOCALCIFEROL) 1.25 MG (50000 UNIT) PO CAPS
50000.0000 [IU] | ORAL_CAPSULE | ORAL | 0 refills | Status: DC
Start: 2023-02-14 — End: 2024-01-03

## 2023-02-14 MED ORDER — TOPIRAMATE 100 MG PO TABS: 100.0000 mg | ORAL_TABLET | Freq: Every day | ORAL | 0 refills | Status: DC

## 2023-02-15 ENCOUNTER — Other Ambulatory Visit: Payer: Self-pay | Admitting: Family Medicine

## 2023-02-15 ENCOUNTER — Other Ambulatory Visit (HOSPITAL_BASED_OUTPATIENT_CLINIC_OR_DEPARTMENT_OTHER): Payer: Self-pay

## 2023-02-15 DIAGNOSIS — K5909 Other constipation: Secondary | ICD-10-CM

## 2023-02-15 MED ORDER — LINACLOTIDE 145 MCG PO CAPS
145.0000 ug | ORAL_CAPSULE | Freq: Every day | ORAL | 1 refills | Status: DC
Start: 2023-02-15 — End: 2023-05-31

## 2023-02-19 ENCOUNTER — Encounter: Payer: Self-pay | Admitting: Internal Medicine

## 2023-02-28 ENCOUNTER — Other Ambulatory Visit (INDEPENDENT_AMBULATORY_CARE_PROVIDER_SITE_OTHER): Payer: Self-pay | Admitting: Family Medicine

## 2023-02-28 DIAGNOSIS — F3289 Other specified depressive episodes: Secondary | ICD-10-CM

## 2023-03-01 ENCOUNTER — Other Ambulatory Visit: Payer: Self-pay | Admitting: Family Medicine

## 2023-03-01 DIAGNOSIS — F418 Other specified anxiety disorders: Secondary | ICD-10-CM

## 2023-03-06 ENCOUNTER — Other Ambulatory Visit (HOSPITAL_COMMUNITY): Payer: Self-pay

## 2023-03-06 ENCOUNTER — Telehealth: Payer: Self-pay

## 2023-03-06 NOTE — Telephone Encounter (Signed)
Patient Advocate Encounter   Received notification from [employer] that prior authorization is required for Fexofenadine HCl 180MG  tablets   Submitted: n/a Key [id number]  PA not submitted at this time as OTCs are not covered by plan

## 2023-03-07 NOTE — Telephone Encounter (Signed)
We can send in carbinoxamine 4mg  BID PRN.   Malachi Bonds, MD [facility]

## 2023-03-11 ENCOUNTER — Other Ambulatory Visit: Payer: Self-pay | Admitting: Allergy & Immunology

## 2023-05-08 ENCOUNTER — Encounter: Payer: Self-pay | Admitting: Family Medicine

## 2023-05-09 ENCOUNTER — Other Ambulatory Visit: Payer: Self-pay | Admitting: Family Medicine

## 2023-05-16 ENCOUNTER — Other Ambulatory Visit (INDEPENDENT_AMBULATORY_CARE_PROVIDER_SITE_OTHER): Payer: Self-pay | Admitting: Family Medicine

## 2023-05-16 DIAGNOSIS — E559 Vitamin D deficiency, unspecified: Secondary | ICD-10-CM

## 2023-05-17 ENCOUNTER — Other Ambulatory Visit: Payer: Self-pay

## 2023-05-17 ENCOUNTER — Ambulatory Visit (INDEPENDENT_AMBULATORY_CARE_PROVIDER_SITE_OTHER): Payer: 59 | Admitting: Internal Medicine

## 2023-05-17 ENCOUNTER — Encounter: Payer: Self-pay | Admitting: Internal Medicine

## 2023-05-17 ENCOUNTER — Other Ambulatory Visit (HOSPITAL_BASED_OUTPATIENT_CLINIC_OR_DEPARTMENT_OTHER): Payer: Self-pay

## 2023-05-17 VITALS — BP 130/90 | HR 80 | Ht 67.5 in | Wt 251.5 lb

## 2023-05-17 DIAGNOSIS — K5909 Other constipation: Secondary | ICD-10-CM | POA: Diagnosis not present

## 2023-05-17 DIAGNOSIS — Z1211 Encounter for screening for malignant neoplasm of colon: Secondary | ICD-10-CM | POA: Diagnosis not present

## 2023-05-17 MED ORDER — PLENVU 140 G PO SOLR
1.0000 | Freq: Once | ORAL | 0 refills | Status: DC
  Filled 2023-05-17: qty 3, 1d supply, fill #0

## 2023-05-17 MED ORDER — PLENVU 140 G PO SOLR: 1.0000 | Freq: Once | ORAL | 0 refills | Status: AC

## 2023-05-17 NOTE — Progress Notes (Signed)
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  She is new to GI.  Patient tells me that she has had lifelong problems with constipation.  It has not been unusual for her to go weeks without bowel movements.  Over the past 3 months she has been on Linzess.  When taking Linzess regularly, it is quite effective.  As a matter fact, it may result in diarrhea and nocturnal incontinence.  She was also placed on semaglutide for weight reduction.  This resulted in more constipation.  Currently taking Linzess every other day and having about 3 or 4 bowel movements per week.  This is acceptable.  She does become uncomfortable with fullness sensation in the lower abdomen, when going long periods of time without a bowel movement.  She denies rectal bleeding.  No family history of colon cancer.  No prior colonoscopy.  REVIEW OF SYSTEMS:  All non-GI ROS negative unless otherwise stated in the HPI except for headaches, fatigue, night sweats  Past Medical History:  Diagnosis Date   Anxiety with depression 07/01/2017   Fibromyalgia    Vaginal Pap smear, abnormal     Past Surgical History:  Procedure Laterality Date   COLPOSCOPY     EGG RETRIEVAL SURGERY      Social History Kerri Guerra  reports that she quit smoking about 3 years ago. Her smoking use included cigarettes. She has never used smokeless tobacco. She reports current alcohol use of about 3.0 standard drinks of alcohol per week. She reports that she does not use drugs.  family history includes Diabetes in her mother; Heart failure in her mother; Hypertension in her mother; Prostate cancer in her father; Stomach cancer in her maternal grandmother.  No Known Allergies     PHYSICAL EXAMINATION: Vital signs: BP (!) 120/94 (BP Location: Left Arm, Patient  Position: Sitting, Cuff Size: Large)   Pulse 80   Ht 5' 7.5" (1.715 m) Comment: height measured without shoes  Wt 251 lb 8 oz (114.1 kg)   LMP 01/14/2023   BMI 38.81 kg/m   Constitutional: generally well-appearing, no acute distress Psychiatric: alert and oriented x3, cooperative Eyes: extraocular movements intact, anicteric, conjunctiva pink Mouth: oral pharynx moist, no lesions Neck: supple no lymphadenopathy Cardiovascular: heart regular rate and rhythm, no murmur Lungs: clear to auscultation bilaterally Abdomen: soft, nontender, nondistended, no obvious ascites, no peritoneal signs, normal bowel sounds, no organomegaly Rectal: Deferred until colonoscopy Extremities: no clubbing, cyanosis, or lower extremity edema bilaterally Skin: no lesions on visible extremities Neuro: No focal deficits.  Cranial nerves intact  ASSESSMENT:  1.  Chronic constipation.  Currently being managed with Linzess 2.  Colon cancer screening.  Average risk.  Appropriate candidate without contraindication 3.  General Medical problems.  Stable 4.  Morbid obesity.  On semaglutide  PLAN:  1.  Discussion of constipation 2.  Continue Linzess to achieve desired result. 3.  Schedule colonoscopy.The nature of the procedure, as well as the risks, benefits, and alternatives were carefully and thoroughly reviewed with the patient. Ample time for discussion and questions allowed. The patient understood, was satisfied, and agreed to proceed. 4.  Hold semaglutide for minimum of 1, preferably 2 weeks preprocedure.  She understands 5.  Ongoing general medical care with Dr. Carmelia Roller

## 2023-05-17 NOTE — Patient Instructions (Signed)
You have been scheduled for a colonoscopy. Please follow written instructions given to you at your visit today.   Please pick up your prep supplies at the pharmacy within the next 1-3 days.  If you use inhalers (even only as needed), please bring them with you on the day of your procedure.  DO NOT TAKE 7 DAYS PRIOR TO TEST- Trulicity (dulaglutide) Ozempic, Wegovy (semaglutide) Mounjaro (tirzepatide) Bydureon Bcise (exanatide extended release)  DO NOT TAKE 1 DAY PRIOR TO YOUR TEST Rybelsus (semaglutide) Adlyxin (lixisenatide) Victoza (liraglutide) Byetta (exanatide) ___________________________________________________________________________  _______________________________________________________  If your blood pressure at your visit was 140/90 or greater, please contact your primary care physician to follow up on this.  _______________________________________________________  If you are age 26 or older, your body mass index should be between 23-30. Your Body mass index is 38.81 kg/m. If this is out of the aforementioned range listed, please consider follow up with your Primary Care Provider.  If you are age 40 or younger, your body mass index should be between 19-25. Your Body mass index is 38.81 kg/m. If this is out of the aformentioned range listed, please consider follow up with your Primary Care Provider.   ________________________________________________________  The [place] [facility] providers would like to encourage you to use [facility] to communicate with providers for non-urgent requests or questions.  Due to long hold times on the telephone, sending your provider a message by [facility] may be a faster and more efficient way to get a response.  Please allow 48 business hours for a response.  Please remember that this is for non-urgent requests.  _______________________________________________________

## 2023-05-29 ENCOUNTER — Encounter: Payer: Self-pay | Admitting: Internal Medicine

## 2023-05-29 NOTE — Telephone Encounter (Signed)
Kerri Guerra, Please let the patient know the following... 1.  If this is an accurate reflection of your blood pressure, it is higher than ideal.  This issue needs to be addressed with her primary care provider, soon as possible, whom I have copied on this note. 2.  Though a little high, the blood pressure level that she reports, is not such that it would not preclude her from moving forward with her procedure. 3.  You can keep procedure as planned. Thanks, Dr. Marina Goodell

## 2023-05-30 ENCOUNTER — Other Ambulatory Visit: Payer: Self-pay | Admitting: Family Medicine

## 2023-05-30 DIAGNOSIS — K5909 Other constipation: Secondary | ICD-10-CM

## 2023-06-05 ENCOUNTER — Encounter: Payer: Self-pay | Admitting: Internal Medicine

## 2023-06-07 ENCOUNTER — Ambulatory Visit: Payer: 59 | Admitting: Family Medicine

## 2023-06-07 ENCOUNTER — Encounter: Payer: Self-pay | Admitting: Family Medicine

## 2023-06-07 VITALS — BP 132/86 | HR 98 | Temp 99.3°F | Ht 67.5 in | Wt 251.1 lb

## 2023-06-07 DIAGNOSIS — R0683 Snoring: Secondary | ICD-10-CM

## 2023-06-07 DIAGNOSIS — R5383 Other fatigue: Secondary | ICD-10-CM

## 2023-06-07 DIAGNOSIS — R030 Elevated blood-pressure reading, without diagnosis of hypertension: Secondary | ICD-10-CM | POA: Diagnosis not present

## 2023-06-07 NOTE — Patient Instructions (Signed)
Keep the diet clean and stay active.  Aim to do some physical exertion for 150 minutes per week. This is typically divided into 5 days per week, 30 minutes per day. The activity should be enough to get your heart rate up. Anything is better than nothing if you have time constraints.  If you do not hear anything about your referral in the next 1-2 weeks, call our office and ask for an update.  Let us know if you need anything.    

## 2023-06-07 NOTE — Progress Notes (Signed)
Chief Complaint  Patient presents with   Hypertension   Hot Flashes    Subjective Kerri Guerra is a 47 y.o. female who presents for elevated blood pressure follow up. She does monitor home blood pressures. Blood pressures ranging from 130's/90's on average. She is compliant with medications. Patient has these side effects of medication: none She is sometimes adhering to a healthy diet overall. Current exercise: some walking No exertional CP or SOB.  She does snore at night and has fatigue.  She has never had a sleep study.   Past Medical History:  Diagnosis Date   Anxiety with depression 07/01/2017   Fibromyalgia    Vaginal Pap smear, abnormal     Exam BP 132/86 (BP Location: Left Arm, Cuff Size: Large)   Pulse 98   Temp 99.3 F (37.4 C) (Oral)   Ht 5' 7.5" (1.715 m)   Wt 251 lb 2 oz (113.9 kg)   LMP 01/14/2023   SpO2 99%   BMI 38.75 kg/m  General:  well developed, well nourished, in no apparent distress Heart: RRR, no bruits, no LE edema Lungs: clear to auscultation, no accessory muscle use Psych: well oriented with normal range of affect and appropriate judgment/insight  Elevated blood pressure reading  Snoring - Plan: Ambulatory referral to Neurology  Fatigue, unspecified type - Plan: Ambulatory referral to Neurology  Recommended holding off on blood pressure medicine for now.  Monitor blood pressure at home.  May have to start something in the future but would hold off for now.  Counseled on diet and exercise. 2/3.  Will refer to neurology/sleep for OSA evaluation.  This could be contributing to #1. F/u as originally scheduled. The patient voiced understanding and agreement to the plan.  Jilda Roche [place], DO 06/07/23  4:37 PM

## 2023-06-18 ENCOUNTER — Ambulatory Visit (AMBULATORY_SURGERY_CENTER): Payer: 59 | Admitting: Internal Medicine

## 2023-06-18 ENCOUNTER — Encounter: Payer: Self-pay | Admitting: Internal Medicine

## 2023-06-18 VITALS — BP 101/74 | HR 94 | Temp 97.1°F | Resp 17 | Ht 67.5 in | Wt 251.0 lb

## 2023-06-18 DIAGNOSIS — K5909 Other constipation: Secondary | ICD-10-CM

## 2023-06-18 DIAGNOSIS — Z1211 Encounter for screening for malignant neoplasm of colon: Secondary | ICD-10-CM

## 2023-06-18 MED ORDER — SODIUM CHLORIDE 0.9 % IV SOLN: 500.0000 mL | Freq: Once | INTRAVENOUS | Status: DC

## 2023-06-18 NOTE — Progress Notes (Signed)
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  She is new to GI.   Patient tells me that she has had lifelong problems with constipation.  It has not been unusual for her to go weeks without bowel movements.  Over the past 3 months she has been on Linzess.  When taking Linzess regularly, it is quite effective.  As a matter fact, it may result in diarrhea and nocturnal incontinence.  She was also placed on semaglutide for weight reduction.  This resulted in more constipation.  Currently taking Linzess every other day and having about 3 or 4 bowel movements per week.  This is acceptable.  She does become uncomfortable with fullness sensation in the lower abdomen, when going long periods of time without a bowel movement.  She denies rectal bleeding.  No family history of colon cancer.  No prior colonoscopy.   REVIEW OF SYSTEMS:   All non-GI ROS negative unless otherwise stated in the HPI except for headaches, fatigue, night sweats       Past Medical History:  Diagnosis Date   Anxiety with depression 07/01/2017   Fibromyalgia     Vaginal Pap smear, abnormal                 Past Surgical History:  Procedure Laterality Date   COLPOSCOPY       EGG RETRIEVAL SURGERY              Social History Analy Xue  reports that she quit smoking about 3 years ago. Her smoking use included cigarettes. She has never used smokeless tobacco. She reports current alcohol use of about 3.0 standard drinks of alcohol per week. She reports that she does not use drugs.   family history includes Diabetes in her mother; Heart failure in her mother; Hypertension in her mother; Prostate cancer in her father; Stomach cancer in her maternal grandmother.   Allergies  No Known Allergies          PHYSICAL EXAMINATION: Vital signs: BP (!) 120/94 (BP Location: Left Arm, Patient Position: Sitting, Cuff Size: Large)   Pulse 80   Ht 5' 7.5" (1.715 m) Comment: height measured without shoes  Wt 251 lb 8 oz (114.1 kg)   LMP 01/14/2023   BMI 38.81 kg/m   Constitutional: generally well-appearing, no acute distress Psychiatric: alert and oriented x3, cooperative Eyes: extraocular movements intact, anicteric, conjunctiva pink Mouth: oral pharynx moist, no lesions Neck: supple no lymphadenopathy Cardiovascular: heart regular rate and rhythm, no murmur Lungs: clear to auscultation bilaterally Abdomen: soft, nontender, nondistended, no obvious ascites, no peritoneal signs, normal bowel sounds, no organomegaly Rectal: Deferred until colonoscopy Extremities: no clubbing, cyanosis, or lower extremity edema bilaterally Skin: no lesions on visible extremities Neuro: No focal deficits.  Cranial nerves intact   ASSESSMENT:   1.  Chronic constipation.  Currently being managed with Linzess 2.  Colon cancer screening.  Average risk.  Appropriate candidate without contraindication 3.  General Medical problems.  Stable 4.  Morbid obesity.  On semaglutide   PLAN:   1.  Discussion of constipation 2.  Continue Linzess to achieve desired result. 3.  Schedule colonoscopy.The nature of the procedure, as well as the risks, benefits, and alternatives were carefully and thoroughly reviewed with the patient. Ample time for discussion and questions  allowed. The patient understood, was satisfied, and agreed to proceed. 4.  Hold semaglutide for minimum of 1, preferably 2 weeks preprocedure.  She understands 5.  Ongoing general medical care with Dr. Carmelia Roller

## 2023-06-18 NOTE — Patient Instructions (Signed)
THank you for letting us take care of your healthcare needs today. Please see handouts given to you on Diverticulosis.    YOU HAD AN ENDOSCOPIC PROCEDURE TODAY AT THE [facility]:   Refer to the procedure report that was given to you for any specific questions about what was found during the examination.  If the procedure report does not answer your questions, please call your gastroenterologist to clarify.  If you requested that your care partner not be given the details of your procedure findings, then the procedure report has been included in a sealed envelope for you to review at your convenience later.  YOU SHOULD EXPECT: Some feelings of bloating in the abdomen. Passage of more gas than usual.  Walking can help get rid of the air that was put into your GI tract during the procedure and reduce the bloating. If you had a lower endoscopy (such as a colonoscopy or flexible sigmoidoscopy) you may notice spotting of blood in your stool or on the toilet paper. If you underwent a bowel prep for your procedure, you may not have a normal bowel movement for a few days.  Please Note:  You might notice some irritation and congestion in your nose or some drainage.  This is from the oxygen used during your procedure.  There is no need for concern and it should clear up in a day or so.  SYMPTOMS TO REPORT IMMEDIATELY:  Following lower endoscopy (colonoscopy or flexible sigmoidoscopy):  Excessive amounts of blood in the stool  Significant tenderness or worsening of abdominal pains  Swelling of the abdomen that is new, acute  Fever of 100F or higher   For urgent or emergent issues, a gastroenterologist can be reached at any hour by calling [phone]. Do not use MyChart messaging for urgent concerns.    DIET:  We do recommend a small meal at first, but then you may proceed to your regular diet.  Drink plenty of fluids but you should avoid alcoholic beverages for 24 hours.  ACTIVITY:   You should plan to take it easy for the rest of today and you should NOT DRIVE or use heavy machinery until tomorrow (because of the sedation medicines used during the test).    FOLLOW UP: Our staff will call the number listed on your records the next business day following your procedure.  We will call around 7:15- 8:00 am to check on you and address any questions or concerns that you may have regarding the information given to you following your procedure. If we do not reach you, we will leave a message.     If any biopsies were taken you will be contacted by phone or by letter within the next 1-3 weeks.  Please call us at [phone] if you have not heard about the biopsies in 3 weeks.    SIGNATURES/CONFIDENTIALITY: You and/or your care partner have signed paperwork which will be entered into your electronic medical record.  These signatures attest to the fact that that the information above on your After Visit Summary has been reviewed and is understood.  Full responsibility of the confidentiality of this discharge information lies with you and/or your care-partner.

## 2023-06-18 NOTE — Progress Notes (Signed)
I have reviewed the patient's medical history in detail and updated the computerized patient record.

## 2023-06-18 NOTE — Progress Notes (Signed)
Sedate, gd SR, tolerated procedure well, VSS, report to RN 

## 2023-06-18 NOTE — Op Note (Signed)
[facility] Patient Name: Kerri Guerra Procedure Date: 06/18/2023 2:45 PM MRN: [id number] Endoscopist: Wilhemina Bonito. Marina Goodell , MD, [id number] Age: 47 Referring MD:  Date of Birth: Dec 19, 1975 Gender: Female Account #: [id number] Procedure:                Colonoscopy Indications:              Screening for colorectal malignant neoplasm,                            Incidental constipation noted Medicines:                Monitored Anesthesia Care Procedure:                Pre-Anesthesia Assessment:                           - Prior to the procedure, a History and Physical                            was performed, and patient medications and                            allergies were reviewed. The patient's tolerance of                            previous anesthesia was also reviewed. The risks                            and benefits of the procedure and the sedation                            options and risks were discussed with the patient.                            All questions were answered, and informed consent                            was obtained. Prior Anticoagulants: The patient has                            taken no anticoagulant or antiplatelet agents.                            After reviewing the risks and benefits, the patient                            was deemed in satisfactory condition to undergo the                            procedure.                           After obtaining informed consent, the colonoscope  was passed under direct vision. Throughout the                            procedure, the patient's blood pressure, pulse, and                            oxygen saturations were monitored continuously. The                            CF [id number] #[id number] was introduced through the anus                            and advanced to the the cecum, identified by                            appendiceal orifice and ileocecal valve. The                             ileocecal valve, appendiceal orifice, and rectum                            were photographed. The quality of the bowel                            preparation was excellent. The colonoscopy was                            performed without difficulty. The patient tolerated                            the procedure well. The bowel preparation used was                            SUPREP via split dose instruction. Scope In: 3:05:39 PM Scope Out: 3:18:33 PM Scope Withdrawal Time: 0 hours 9 minutes 16 seconds  Total Procedure Duration: 0 hours 12 minutes 54 seconds  Findings:                 A few small-mouthed diverticula were found in the                            sigmoid colon.                           The exam was otherwise without abnormality on                            direct and retroflexion views. Complications:            No immediate complications. Estimated blood loss:                            None. Estimated Blood Loss:     Estimated blood loss: none. Impression:               - Diverticulosis in the  sigmoid colon.                           - The examination was otherwise normal on direct                            and retroflexion views.                           - No specimens collected. Recommendation:           - Repeat colonoscopy in 10 years for screening                            purposes.                           - Patient has a contact number available for                            emergencies. The signs and symptoms of potential                            delayed complications were discussed with the                            patient. Return to normal activities tomorrow.                            Written discharge instructions were provided to the                            patient.                           - Resume previous diet.                           - Continue present medications. Wilhemina Bonito. Marina Goodell, MD 06/18/2023 3:22:52 PM This  report has been signed electronically.

## 2023-06-19 ENCOUNTER — Telehealth: Payer: Self-pay

## 2023-06-19 NOTE — Telephone Encounter (Signed)
  Follow up Call-     06/18/2023    2:17 PM  Call back number  Post procedure Call Back phone  # [phone]  Permission to leave phone message Yes    Post op call attempted, no answer, left WM.

## 2023-06-20 ENCOUNTER — Encounter: Payer: Self-pay | Admitting: Family Medicine

## 2023-07-16 ENCOUNTER — Encounter: Payer: Self-pay | Admitting: Neurology

## 2023-07-16 ENCOUNTER — Ambulatory Visit (INDEPENDENT_AMBULATORY_CARE_PROVIDER_SITE_OTHER): Payer: 59 | Admitting: Neurology

## 2023-07-16 VITALS — BP 139/90 | HR 87 | Ht 67.0 in | Wt 250.0 lb

## 2023-07-16 DIAGNOSIS — R0683 Snoring: Secondary | ICD-10-CM | POA: Diagnosis not present

## 2023-07-16 DIAGNOSIS — R030 Elevated blood-pressure reading, without diagnosis of hypertension: Secondary | ICD-10-CM

## 2023-07-16 DIAGNOSIS — Z820 Family history of epilepsy and other diseases of the nervous system: Secondary | ICD-10-CM

## 2023-07-16 DIAGNOSIS — E669 Obesity, unspecified: Secondary | ICD-10-CM

## 2023-07-16 DIAGNOSIS — G4719 Other hypersomnia: Secondary | ICD-10-CM

## 2023-07-16 DIAGNOSIS — R351 Nocturia: Secondary | ICD-10-CM

## 2023-07-16 DIAGNOSIS — Z9189 Other specified personal risk factors, not elsewhere classified: Secondary | ICD-10-CM | POA: Diagnosis not present

## 2023-07-16 DIAGNOSIS — G4700 Insomnia, unspecified: Secondary | ICD-10-CM

## 2023-07-16 DIAGNOSIS — R519 Headache, unspecified: Secondary | ICD-10-CM

## 2023-07-16 NOTE — Progress Notes (Signed)
Subjective:    Patient ID: Kerri Guerra is a 47 y.o. female.  HPI    Huston Foley, MD, PhD [facility] [address], [address] [address] [place], [place] [address]  Dear Dr. Carmelia Roller,  I saw your patient, Kerri Guerra, upon your kind request in my sleep clinic today for initial consultation of her sleep disorder, in particular, concern for underlying obstructive sleep apnea.  The patient is unaccompanied today.  As you know, [name] is a 47 year old female with an underlying medical history of fibromyalgia, elevated blood pressure readings, anxiety, depression, and obesity, who reports snoring and excessive daytime somnolence. Her Epworth sleepiness score is 12 out of 24, fatigue severity score is 56 out of 63.  She reports having had a home sleep test in or around 2017 which was negative for sleep apnea at the time.  She has not been on PAP therapy.  Her weight has been more or less stable.  She is working on weight loss through a remote weight management clinic.  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  She has been sleepy at the wheel.  She had to pull over to take a nap.  She naps when she can, she has chronic trouble going to sleep and staying asleep for years, even decades.  She has been on trazodone in the past but was drowsy from it during the day.  She is no longer on trazodone.  She has tried melatonin in the past but not recently.  She has a family history of sleep apnea affecting her brother and her dad also had sleep apnea, dad has passed away.  She is divorced, no children, 1 dog in the household.  She lives alone.  She does not typically watch TV in her bedroom, she tries to do meditation at night or read in bed.  She typically goes to bed around 1 and rise time is typically around 8.  She has nocturia about 3 times per average night and has had occasional morning  headaches for which she occasionally takes over-the-counter ibuprofen.  She denies any telltale symptoms of cataplexy, no hypnagogic or hypnopompic hallucinations typically, occasionally remembers dreaming.  She drinks limited caffeine, not daily.  She quit smoking in January 2022.  She drinks alcohol very infrequently, approximately once every 3 months. I reviewed your office note from 06/07/2023.  Her Past Medical History Is Significant For: Past Medical History:  Diagnosis Date   Anxiety with depression 07/01/2017   Fibromyalgia    Vaginal Pap smear, abnormal     Her Past Surgical History Is Significant For: Past Surgical History:  Procedure Laterality Date   COLPOSCOPY     EGG RETRIEVAL SURGERY      Her Family History Is Significant For: Family History  Problem Relation Age of Onset   Diabetes Mother    Hypertension Mother    Heart failure Mother    Prostate cancer Father    Sleep apnea Father    Sleep apnea Brother    Stomach cancer Maternal Grandmother    Stroke Neg Hx    Allergic rhinitis Neg Hx    Angioedema Neg Hx    Atopy Neg Hx    Asthma Neg Hx    Immunodeficiency Neg Hx    Eczema Neg Hx    Urticaria Neg Hx     Her Social History  Is Significant For: Social History   Socioeconomic History   Marital status: Married    Spouse name: Not on file   Number of children: 0   Years of education: Not on file   Highest education level: Not on file  Occupational History   Occupation: Psychologist  Tobacco Use   Smoking status: Former    Current packs/day: 0.00    Types: Cigarettes    Quit date: 2021    Years since quitting: 3.7   Smokeless tobacco: Never  Vaping Use   Vaping status: Never Used  Substance and Sexual Activity   Alcohol use: Yes    Alcohol/week: 3.0 standard drinks of alcohol    Types: 3 Standard drinks or equivalent per week    Comment: social   Drug use: No   Sexual activity: Yes  Other Topics Concern   Not on file  Social History Narrative    Not on file   Social Determinants of Health   Financial Resource Strain: Not on file  Food Insecurity: Not on file  Transportation Needs: Not on file  Physical Activity: Not on file  Stress: Not on file  Social Connections: Not on file    Her Allergies Are:  No Known Allergies:   Her Current Medications Are:  Outpatient Encounter Medications as of 07/16/2023  Medication Sig   aluminum chloride (DRYSOL) 20 % external solution Apply topically at bedtime.   busPIRone (BUSPAR) 15 MG tablet Take 1 tablet by mouth twice daily.   DULoxetine (CYMBALTA) 60 MG capsule Take 1 capsule (60 mg total) by mouth daily. (Patient taking differently: Take 90 mg by mouth daily.)   emtricitabine-tenofovir (TRUVADA) 200-300 MG tablet Take 1 tablet by mouth daily.   fexofenadine (ALLEGRA) 180 MG tablet Take 1 tablet (180 mg total) by mouth every morning.   ibuprofen (ADVIL,MOTRIN) 400 MG tablet Take 400 mg by mouth every 6 (six) hours as needed.    LINZESS 145 MCG CAPS capsule TAKE 1 CAPSULE(145 MCG) BY MOUTH DAILY BEFORE BREAKFAST   montelukast (SINGULAIR) 10 MG tablet Take 1 tablet (10 mg total) by mouth daily.   norethindrone-ethinyl estradiol (LOESTRIN 1/20, 21,) 1-20 MG-MCG tablet Take 1 tablet by mouth daily.   SEMAGLUTIDE-WEIGHT MANAGEMENT [place] Inject 1 Dose into the skin once a week.   traZODone (DESYREL) 50 MG tablet Take 1/2 to 1 tablet by mouth at bedtime as needed for sleep.   triamcinolone ointment (KENALOG) 0.1 % Apply in the ear canal with a q-tip twice daily as directed.   Vitamin D, Ergocalciferol, (DRISDOL) 1.25 MG (50000 UNIT) CAPS capsule Take 1 capsule (50,000 Units total) by mouth every 7 (seven) days.   levocetirizine (XYZAL) 5 MG tablet Take 1 tablet (5 mg total) by mouth every evening.   No facility-administered encounter medications on file as of 07/16/2023.  :   Review of Systems:  Out of a complete 14 point review of systems, all are reviewed and negative with the exception  of these symptoms as listed below:  Review of Systems  Neurological:        Pt here for sleep consult Pt snores ,fatigue,headaches,hypertension  Pt states took HST in 2017  no cpap needed   ESS:12 FSS:56    Objective:  Neurological Exam  Physical Exam Physical Examination:   Vitals:   07/16/23 1400  BP: (!) 139/90  Pulse: 87    General Examination: The patient is a very pleasant 47 y.o. female in no acute distress. She appears well-developed and  well-nourished and well groomed.   HEENT: Normocephalic, atraumatic, pupils are equal, round and reactive to light, extraocular tracking is good without limitation to gaze excursion or nystagmus noted. Hearing is grossly intact. Face is symmetric with normal facial animation. Speech is clear with no dysarthria noted. There is no hypophonia. There is no lip, neck/head, jaw or voice tremor. Neck is supple with full range of passive and active motion. There are no carotid bruits on auscultation. Oropharynx exam reveals: mild mouth dryness, adequate dental hygiene with braces in place top and bottom, moderate airway crowding secondary to Mallampati class III, slightly asymmetrical palate, no redundant soft palate, uvula small, tonsils on the smaller side, left side easier to see compared to right, neck circumference 14 3/8 inches, no significant overbite.  Tongue protrudes centrally.    Chest: Clear to auscultation without wheezing, rhonchi or crackles noted.  Heart: S1+S2+0, regular and normal without murmurs, rubs or gallops noted.   Abdomen: Soft, non-tender and non-distended.  Extremities: There is no pitting edema in the distal lower extremities bilaterally.   Skin: Warm and dry without trophic changes noted.   Musculoskeletal: exam reveals no obvious joint deformities.   Neurologically:  Mental status: The patient is awake, alert and oriented in all 4 spheres. Her immediate and remote memory, attention, language skills and fund of  knowledge are appropriate. There is no evidence of aphasia, agnosia, apraxia or anomia. Speech is clear with normal prosody and enunciation. Thought process is linear. Mood is normal and affect is normal.  Cranial nerves II - XII are as described above under HEENT exam.  Motor exam: Normal bulk, strength and tone is noted. There is no obvious action or resting tremor.  Fine motor skills and coordination: grossly intact.  Cerebellar testing: No dysmetria or intention tremor. There is no truncal or gait ataxia.  Sensory exam: intact to light touch in the upper and lower extremities.  Gait, station and balance: She stands easily. No veering to one side is noted. No leaning to one side is noted. Posture is age-appropriate and stance is narrow based. Gait shows normal stride length and normal pace. No problems turning are noted.   Assessment and Plan:  In summary, Sandy Dillehay is a very pleasant 47 y.o.-year old female with an underlying medical history of fibromyalgia, elevated blood pressure readings, anxiety, depression, and obesity, whose history and physical exam are concerning for sleep disordered breathing, particularly obstructive sleep apnea (OSA).  While a laboratory attended sleep study is typically considered "gold standard" for evaluation of sleep disordered breathing, we mutually agreed to proceed with a home sleep test at this time.   I had a long chat with the patient about my findings and the diagnosis of sleep apnea, particularly OSA, its prognosis and treatment options. We talked about medical/conservative treatments, surgical interventions and non-pharmacological approaches for symptom control. I explained, in particular, the risks and ramifications of untreated moderate to severe OSA, especially with respect to developing cardiovascular disease down the road, including congestive heart failure (CHF), difficult to treat hypertension, cardiac arrhythmias (particularly A-fib),  neurovascular complications including TIA, stroke and dementia. Even type 2 diabetes has, in part, been linked to untreated OSA. Symptoms of untreated OSA may include (but may not be limited to) daytime sleepiness, nocturia (i.e. frequent nighttime urination), memory problems, mood irritability and suboptimally controlled or worsening mood disorder such as depression and/or anxiety, lack of energy, lack of motivation, physical discomfort, as well as recurrent headaches, especially morning or nocturnal headaches.  We talked about the importance of maintaining a healthy lifestyle and striving for healthy weight. In addition, we talked about the importance of striving for and maintaining good sleep hygiene. I recommended a sleep study at this time. I outlined the differences between a laboratory attended sleep study which is considered more comprehensive and accurate over the option of a home sleep test (HST); the latter may lead to underestimation of sleep disordered breathing in some instances and does not help with diagnosing upper airway resistance syndrome and is not accurate enough to diagnose primary central sleep apnea typically. I outlined possible surgical and non-surgical treatment options of OSA, including the use of a positive airway pressure (PAP) device (i.e. CPAP, AutoPAP/APAP or BiPAP in certain circumstances), a custom-made dental device (aka oral appliance, which would require a referral to a specialist dentist or orthodontist typically, and is generally speaking not considered for patients with full dentures or edentulous state), upper airway surgical options, such as traditional UPPP (which is not considered a first-line treatment) or the Inspire device (hypoglossal nerve stimulator, which would involve a referral for consultation with an ENT surgeon, after careful selection, following inclusion criteria - also not first-line treatment). I explained the PAP treatment option to the patient in  detail, as this is generally considered first-line treatment.  The patient indicated that she would be willing to try PAP therapy, if the need arises. I explained the importance of being compliant with PAP treatment, not only for insurance purposes but primarily to improve patient's symptoms symptoms, and for the patient's long term health benefit, including to reduce Her cardiovascular risks longer-term.  She is strongly advised not to drive when feeling sleepy and to pull over when she feels sleepy and to plan scheduled breaks when she is on a long distance drive.   She is encouraged to try melatonin again at low-dose, 1 or 3 mg, up to 5 mg at night.  We will pick up our discussion about the next steps and treatment options after testing.  We will keep her posted as to the test results by phone call and/or MyChart messaging where possible.  We will plan to follow-up in sleep clinic accordingly as well.  I answered all her questions today and the patient was in agreement.   I encouraged her to call with any interim questions, concerns, problems or updates or email [place] through MyChart.  Generally speaking, sleep test authorizations may take up to 2 weeks, sometimes less, sometimes longer, the patient is encouraged to get in touch with [place] if they do not hear back from the sleep lab staff directly within the next 2 weeks.  Thank you very much for allowing me to participate in the care of this nice patient. If I can be of any further assistance to you please do not hesitate to call me at [phone].  Sincerely,   Huston Foley, MD, PhD

## 2023-07-16 NOTE — Patient Instructions (Signed)

## 2023-08-09 ENCOUNTER — Other Ambulatory Visit: Payer: Self-pay

## 2023-08-20 ENCOUNTER — Other Ambulatory Visit: Payer: Self-pay | Admitting: Family Medicine

## 2023-08-20 DIAGNOSIS — K5909 Other constipation: Secondary | ICD-10-CM

## 2023-08-24 ENCOUNTER — Encounter: Payer: Self-pay | Admitting: Obstetrics & Gynecology

## 2023-08-26 ENCOUNTER — Other Ambulatory Visit: Payer: Self-pay

## 2023-08-26 ENCOUNTER — Other Ambulatory Visit (HOSPITAL_BASED_OUTPATIENT_CLINIC_OR_DEPARTMENT_OTHER): Payer: Self-pay

## 2023-08-26 ENCOUNTER — Ambulatory Visit: Payer: 59 | Admitting: Neurology

## 2023-08-26 DIAGNOSIS — G4700 Insomnia, unspecified: Secondary | ICD-10-CM

## 2023-08-26 DIAGNOSIS — Z9189 Other specified personal risk factors, not elsewhere classified: Secondary | ICD-10-CM

## 2023-08-26 DIAGNOSIS — Z30011 Encounter for initial prescription of contraceptive pills: Secondary | ICD-10-CM

## 2023-08-26 DIAGNOSIS — Z820 Family history of epilepsy and other diseases of the nervous system: Secondary | ICD-10-CM

## 2023-08-26 DIAGNOSIS — G4719 Other hypersomnia: Secondary | ICD-10-CM

## 2023-08-26 DIAGNOSIS — R519 Headache, unspecified: Secondary | ICD-10-CM

## 2023-08-26 DIAGNOSIS — R0683 Snoring: Secondary | ICD-10-CM

## 2023-08-26 DIAGNOSIS — R030 Elevated blood-pressure reading, without diagnosis of hypertension: Secondary | ICD-10-CM

## 2023-08-26 DIAGNOSIS — G4733 Obstructive sleep apnea (adult) (pediatric): Secondary | ICD-10-CM

## 2023-08-26 DIAGNOSIS — R351 Nocturia: Secondary | ICD-10-CM

## 2023-08-26 DIAGNOSIS — E669 Obesity, unspecified: Secondary | ICD-10-CM

## 2023-08-26 MED ORDER — NORETHINDRONE ACET-ETHINYL EST 1-20 MG-MCG PO TABS
1.0000 | ORAL_TABLET | Freq: Every day | ORAL | 3 refills | Status: DC
  Filled 2023-08-26: qty 21, 21d supply, fill #0
  Filled 2023-09-11: qty 21, 21d supply, fill #1
  Filled 2023-10-03: qty 21, 21d supply, fill #2
  Filled 2023-10-23: qty 21, 21d supply, fill #3

## 2023-08-28 NOTE — Procedures (Signed)
   [facility]  HOME SLEEP TEST (Watch PAT) REPORT  STUDY DATE: 08/26/2023  DOB: 1976/05/22  MRN: [id number]  ORDERING CLINICIAN: Huston Foley, MD, PhD   REFERRING CLINICIAN: Sharlene Dory, DO   CLINICAL INFORMATION/HISTORY: 47 year old female with an underlying medical history of fibromyalgia, elevated blood pressure readings, anxiety, depression, and obesity, who reports snoring and excessive daytime somnolence.   Epworth sleepiness score: 12/24.  BMI: 39.2 kg/m  FINDINGS:   Sleep Summary:   Total Recording Time (hours, min): 8 hours, 15 min  Total Sleep Time (hours, min):  6 hours, 20 min  Percent REM (%):    18.3%   Respiratory Indices:   Calculated pAHI (per hour):  13.5/hour         REM pAHI:    26.5/hour       NREM pAHI: 10.5/hour  Central pAHI: 0.9/hour  Oxygen Saturation Statistics:    Oxygen Saturation (%) Mean: 96%   Minimum oxygen saturation (%):                 84%   O2 Saturation Range (%): 84 - 100%    O2 Saturation (minutes) <=88%: 0.1 min  Pulse Rate Statistics:   Pulse Mean (bpm):    74/min    Pulse Range (54 - 118/min)   IMPRESSION: OSA (obstructive sleep apnea), mild  RECOMMENDATION:  This home sleep test demonstrates overall mild obstructive sleep apnea with a total AHI of 13.5/hour and O2 nadir of 84%. Snoring was detected, in the mild to moderate range. Given the patient's medical history and sleep related complaints, therapy with a positive airway pressure device is a reasonable first-line choice and clinically recommended. Treatment can be achieved in the form of autoPAP trial/titration at home for now. A full night, in-lab PAP titration study may aid in improving proper treatment settings and with mask fit, if needed, down the road. Alternative treatments may include weight loss (where appropriate) along with avoidance of the supine sleep position (if possible), or an oral appliance in appropriate  candidates.   Please note that untreated obstructive sleep apnea may carry additional perioperative morbidity. Patients with significant obstructive sleep apnea should receive perioperative PAP therapy and the surgeons and particularly the anesthesiologist should be informed of the diagnosis and the severity of the sleep disordered breathing. The patient should be cautioned not to drive, work at heights, or operate dangerous or heavy equipment when tired or sleepy. Review and reiteration of good sleep hygiene measures should be pursued with any patient. Other causes of the patient's symptoms, including circadian rhythm disturbances, an underlying mood disorder, medication effect and/or an underlying medical problem cannot be ruled out based on this test. Clinical correlation is recommended.  The patient and her referring provider will be notified of the test results. The patient will be seen in follow up in sleep clinic at [facility], as necessary.  I certify that I have reviewed the raw data recording prior to the issuance of this report in accordance with the standards of the American Academy of Sleep Medicine (AASM).  INTERPRETING PHYSICIAN:   Huston Foley, MD, PhD Medical Director, [facility]) Diplomat, ABPN (Neurology and Sleep)   [facility] [address] [place], [place] [address] [phone]

## 2023-08-28 NOTE — Telephone Encounter (Addendum)
Bobbye Morton, CMA  Ellis Parents Candie Chroman, Efraim Kaufmann; Angus Seller, Clovis Riley New orders have been placed for the above pt, DOB: [birth year] Thanks  New, Maryruth Bun, Abbe Amsterdam, CMA; Julius Bowels; Kathe Becton; 1 other Received, Thank you!

## 2023-08-28 NOTE — Addendum Note (Signed)
Addended by: Huston Foley on: 08/28/2023 10:05 AM   Modules accepted: Orders

## 2023-09-18 ENCOUNTER — Telehealth: Payer: Self-pay | Admitting: Neurology

## 2023-09-18 NOTE — Telephone Encounter (Signed)
Pt called to schedule Cpap appt, states she received the machine 09/10/2023. Scheduled

## 2023-11-04 ENCOUNTER — Telehealth: Payer: Self-pay | Admitting: Neurology

## 2023-11-04 ENCOUNTER — Ambulatory Visit: Payer: 59 | Admitting: Neurology

## 2023-11-04 ENCOUNTER — Ambulatory Visit (INDEPENDENT_AMBULATORY_CARE_PROVIDER_SITE_OTHER): Payer: 59 | Admitting: Adult Health

## 2023-11-04 ENCOUNTER — Encounter: Payer: Self-pay | Admitting: Adult Health

## 2023-11-04 VITALS — BP 130/100 | HR 106 | Ht 68.0 in | Wt 215.6 lb

## 2023-11-04 DIAGNOSIS — G4733 Obstructive sleep apnea (adult) (pediatric): Secondary | ICD-10-CM | POA: Diagnosis not present

## 2023-11-04 NOTE — Progress Notes (Signed)
PATIENT: Kerri Guerra DOB: Apr 10, 1976  REASON FOR VISIT: follow up HISTORY FROM: patient PRIMARY NEUROLOGIST: Dr. Frances Furbish  Chief Complaint  Patient presents with   Follow-up    Rm 5, alone. initial cpap.  She states she takes off in night, feels leak.     HISTORY OF PRESENT ILLNESS: Today 11/04/23:  Kerri Guerra is a 48 y.o. female with a history of OSA on CPAP. Returns today for follow-up.  She reports that the CPAP is working well although she states that she still sleeps a lot and often does not keep the CPAP on the entire time she is sleeping.  She also has a DreamWear fullface and notices that water pulls in the mask at night.  Which is part of the reason of her taking it off.  Her download is below        REVIEW OF SYSTEMS: Out of a complete 14 system review of symptoms, the patient complains only of the following symptoms, and all other reviewed systems are negative.  FSS ESS  ALLERGIES: No Known Allergies  HOME MEDICATIONS: Outpatient Medications Prior to Visit  Medication Sig Dispense Refill   aluminum chloride (DRYSOL) 20 % external solution Apply topically at bedtime. 60 mL 0   busPIRone (BUSPAR) 15 MG tablet Take 1 tablet by mouth twice daily. 180 tablet 0   DULoxetine (CYMBALTA) 60 MG capsule Take 1 capsule (60 mg total) by mouth daily. (Patient taking differently: Take 90 mg by mouth daily.) 90 capsule 2   emtricitabine-tenofovir (TRUVADA) 200-300 MG tablet Take 1 tablet by mouth daily.     fexofenadine (ALLEGRA) 180 MG tablet Take 1 tablet (180 mg total) by mouth every morning. 90 tablet 1   ibuprofen (ADVIL,MOTRIN) 400 MG tablet Take 400 mg by mouth every 6 (six) hours as needed.      linaclotide (LINZESS) 145 MCG CAPS capsule Take 1 capsule (145 mcg total) by mouth daily before breakfast. 90 capsule 0   montelukast (SINGULAIR) 10 MG tablet Take 1 tablet (10 mg total) by mouth daily. 90 tablet 2   norethindrone-ethinyl estradiol (LOESTRIN  1/20, 21,) 1-20 MG-MCG tablet Take 1 tablet by mouth daily. 21 tablet 3   SEMAGLUTIDE-WEIGHT MANAGEMENT Vance Inject 1 Dose into the skin once a week.     traZODone (DESYREL) 50 MG tablet Take 1/2 to 1 tablet by mouth at bedtime as needed for sleep. 30 tablet 4   triamcinolone ointment (KENALOG) 0.1 % Apply in the ear canal with a q-tip twice daily as directed. 80 g 5   Vitamin D, Ergocalciferol, (DRISDOL) 1.25 MG (50000 UNIT) CAPS capsule Take 1 capsule (50,000 Units total) by mouth every 7 (seven) days. 12 capsule 0   levocetirizine (XYZAL) 5 MG tablet Take 1 tablet (5 mg total) by mouth every evening. 90 tablet 2   No facility-administered medications prior to visit.    PAST MEDICAL HISTORY: Past Medical History:  Diagnosis Date   Anxiety with depression 07/01/2017   Fibromyalgia    Vaginal Pap smear, abnormal     PAST SURGICAL HISTORY: Past Surgical History:  Procedure Laterality Date   COLPOSCOPY     EGG RETRIEVAL SURGERY      FAMILY HISTORY: Family History  Problem Relation Age of Onset   Diabetes Mother    Hypertension Mother    Heart failure Mother    Prostate cancer Father    Sleep apnea Father    Sleep apnea Brother    Stomach cancer Maternal Grandmother  Stroke Neg Hx    Allergic rhinitis Neg Hx    Angioedema Neg Hx    Atopy Neg Hx    Asthma Neg Hx    Immunodeficiency Neg Hx    Eczema Neg Hx    Urticaria Neg Hx     SOCIAL HISTORY: Social History   Socioeconomic History   Marital status: Married    Spouse name: Not on file   Number of children: 0   Years of education: Not on file   Highest education level: Not on file  Occupational History   Occupation: Psychologist  Tobacco Use   Smoking status: Former    Current packs/day: 0.00    Types: Cigarettes    Quit date: 2021    Years since quitting: 4.0   Smokeless tobacco: Never  Vaping Use   Vaping status: Never Used  Substance and Sexual Activity   Alcohol use: Yes    Alcohol/week: 3.0 standard  drinks of alcohol    Types: 3 Standard drinks or equivalent per week    Comment: social   Drug use: No   Sexual activity: Yes  Other Topics Concern   Not on file  Social History Narrative   Not on file   Social Drivers of Health   Financial Resource Strain: Not on file  Food Insecurity: Not on file  Transportation Needs: Not on file  Physical Activity: Not on file  Stress: Not on file  Social Connections: Not on file  Intimate Partner Violence: Not on file      PHYSICAL EXAM  Vitals:   11/04/23 0904  BP: (!) 130/100  Pulse: (!) 106  SpO2: 99%  Weight: 215 lb 9.6 oz (97.8 kg)  Height: 5\' 8"  (1.727 m)   Body mass index is 32.78 kg/m.  Generalized: Well developed, in no acute distress  Chest: Lungs clear to auscultation bilaterally  Neurological examination  Mentation: Alert oriented to time, place, history taking. Follows all commands speech and language fluent Cranial nerve II-XII: Facial symmetry noted   DIAGNOSTIC DATA (LABS, IMAGING, TESTING) - I reviewed patient records, labs, notes, testing and imaging myself where available.  Lab Results  Component Value Date   WBC 4.1 01/26/2022   HGB 13.0 01/26/2022   HCT 39.5 01/26/2022   MCV 88.6 01/26/2022   PLT 314.0 01/26/2022      Component Value Date/Time   NA 138 11/19/2022 1514   K 4.6 11/19/2022 1514   CL 104 11/19/2022 1514   CO2 20 11/19/2022 1514   GLUCOSE 96 11/19/2022 1514   GLUCOSE 94 01/26/2022 1316   BUN 15 11/19/2022 1514   CREATININE 0.95 11/19/2022 1514   CREATININE 1.06 11/15/2017 1638   CALCIUM 9.1 11/19/2022 1514   PROT 6.8 11/19/2022 1514   ALBUMIN 3.9 11/19/2022 1514   AST 14 11/19/2022 1514   ALT 14 11/19/2022 1514   ALKPHOS 77 11/19/2022 1514   BILITOT <0.2 11/19/2022 1514   Lab Results  Component Value Date   CHOL 148 05/15/2022   HDL 42 05/15/2022   LDLCALC 91 05/15/2022   TRIG 77 05/15/2022   CHOLHDL 3.5 05/15/2022   Lab Results  Component Value Date   HGBA1C 5.3  11/19/2022   Lab Results  Component Value Date   VITAMINB12 1,207 05/15/2022   Lab Results  Component Value Date   TSH 2.670 05/15/2022      ASSESSMENT AND PLAN 48 y.o. year old female  has a past medical history of Anxiety with depression (07/01/2017), Fibromyalgia,  and Vaginal Pap smear, abnormal. here with:  OSA on CPAP  - CPAP compliance excellent - Good treatment of AHI  - Encourage patient to use CPAP nightly and > 4 hours each night -During the visit today we did adjust her humidity and I showed her how to do this. - F/U in 1 year or sooner if needed   Butch Penny, MSN, NP-C 11/04/2023, 9:14 AM [facility] [address], [address] [place], [place] [address] [phone]

## 2023-11-04 NOTE — Telephone Encounter (Signed)
Reschedule Dr. Frances Furbish out

## 2023-11-04 NOTE — Telephone Encounter (Signed)
Rescheduled patient for today with Aundra Millet, NP due to patient's work  schedule conflict.

## 2023-11-04 NOTE — Progress Notes (Signed)
I called the patient directly after her visit.  She had reported to the nurse feeling dizzy.  She did not mention this to me.  Her diastolic was slightly elevated and heart rate was also elevated.  She states that the dizziness has resolved.  She has a way to check her blood pressure at home.  She feels that she was dizzy because she was rushing to this appointment.  I did advise that if her blood pressure remains elevated or she continues to have dizziness she should make her PCP aware.  She voiced understanding and appreciation for the call

## 2023-11-06 ENCOUNTER — Ambulatory Visit: Payer: 59 | Admitting: Adult Health

## 2023-11-26 ENCOUNTER — Other Ambulatory Visit: Payer: Self-pay

## 2023-12-03 ENCOUNTER — Encounter: Payer: Self-pay | Admitting: Family Medicine

## 2023-12-03 ENCOUNTER — Encounter: Payer: Self-pay | Admitting: Obstetrics & Gynecology

## 2023-12-04 ENCOUNTER — Other Ambulatory Visit (HOSPITAL_COMMUNITY): Payer: Self-pay

## 2023-12-04 ENCOUNTER — Other Ambulatory Visit (HOSPITAL_BASED_OUTPATIENT_CLINIC_OR_DEPARTMENT_OTHER): Payer: Self-pay

## 2023-12-04 ENCOUNTER — Other Ambulatory Visit: Payer: Self-pay

## 2023-12-04 DIAGNOSIS — Z30011 Encounter for initial prescription of contraceptive pills: Secondary | ICD-10-CM

## 2023-12-04 MED ORDER — NORETHINDRONE ACET-ETHINYL EST 1-20 MG-MCG PO TABS
1.0000 | ORAL_TABLET | Freq: Every day | ORAL | 1 refills | Status: AC
  Filled 2023-12-04: qty 21, 21d supply, fill #0
  Filled 2023-12-20: qty 21, 21d supply, fill #1

## 2023-12-04 NOTE — Progress Notes (Signed)
 Patient sent Mychart message requesting a refill on her birth control. Lo Estrin 1-20 mg take 1 tablet PO daily was sent to her pharmacy.  Adasyn Mcadams l Robecca Fulgham, CMA

## 2024-01-03 ENCOUNTER — Telehealth: Admitting: Family Medicine

## 2024-01-03 ENCOUNTER — Encounter: Payer: Self-pay | Admitting: Family Medicine

## 2024-01-03 DIAGNOSIS — K5909 Other constipation: Secondary | ICD-10-CM | POA: Diagnosis not present

## 2024-01-03 MED ORDER — LINACLOTIDE 290 MCG PO CAPS: 290.0000 ug | ORAL_CAPSULE | Freq: Every day | ORAL | 2 refills | Status: DC

## 2024-01-03 NOTE — Progress Notes (Signed)
 Chief Complaint  Patient presents with   Constipation    Patient presents today for constipation.    Subjective: Patient is a 48 y.o. female here for constipation. We are interacting via web portal for an electronic face-to-face visit. I verified patient's ID using 2 identifiers. Patient agreed to proceed with visit via this method. Patient is at home, I am at office. Patient and I are present for visit.   Hx of chronic constipation. Worsening over the past 2-3 weeks. She has been using Metamucil and adding fibers. Eating less 2/2 semaglutide. Most recent dosage change around 3 mo ago. She is not having pain, N/V, bleeding. She has resorted to enemas. On Linzess 145 mcg/d. Was on every other day but went daily after starting GLP-1.    Past Medical History:  Diagnosis Date   Anxiety with depression 07/01/2017   Fibromyalgia    Vaginal Pap smear, abnormal     Objective: No conversational dyspnea Age appropriate judgment and insight Nml affect and mood  Assessment and Plan: Chronic constipation - Plan: linaclotide (LINZESS) 290 MCG CAPS capsule  Chronic, uncontrolled. Increase Linzess from 145 mcg/d to 290 mcg/d. Maintain high fiber/water intake. Consider prn laxative usage.  F/u in 6 mo for CPE or prn.  The patient voiced understanding and agreement to the plan.  Kerri Roche Reese, DO 01/03/24  2:06 PM

## 2024-01-06 ENCOUNTER — Telehealth: Admitting: Family Medicine

## 2024-01-08 ENCOUNTER — Other Ambulatory Visit: Payer: Self-pay

## 2024-01-14 ENCOUNTER — Encounter (HOSPITAL_COMMUNITY): Payer: Self-pay

## 2024-01-14 ENCOUNTER — Other Ambulatory Visit (HOSPITAL_COMMUNITY): Payer: Self-pay

## 2024-01-16 ENCOUNTER — Encounter: Payer: Self-pay | Admitting: Obstetrics & Gynecology

## 2024-01-27 ENCOUNTER — Other Ambulatory Visit: Payer: Self-pay | Admitting: Obstetrics & Gynecology

## 2024-01-27 ENCOUNTER — Other Ambulatory Visit (HOSPITAL_COMMUNITY): Payer: Self-pay

## 2024-01-27 DIAGNOSIS — Z30011 Encounter for initial prescription of contraceptive pills: Secondary | ICD-10-CM

## 2024-01-31 ENCOUNTER — Other Ambulatory Visit (HOSPITAL_COMMUNITY): Payer: Self-pay

## 2024-02-04 ENCOUNTER — Other Ambulatory Visit (HOSPITAL_COMMUNITY): Payer: Self-pay

## 2024-03-16 ENCOUNTER — Encounter: Payer: Self-pay | Admitting: Family Medicine

## 2024-04-12 ENCOUNTER — Other Ambulatory Visit: Payer: Self-pay | Admitting: Family Medicine

## 2024-04-12 DIAGNOSIS — K5909 Other constipation: Secondary | ICD-10-CM

## 2024-05-04 ENCOUNTER — Other Ambulatory Visit: Payer: Self-pay | Admitting: Medical Genetics

## 2024-05-11 ENCOUNTER — Encounter: Payer: Self-pay | Admitting: Family Medicine

## 2024-05-11 ENCOUNTER — Ambulatory Visit: Admitting: Family Medicine

## 2024-05-11 ENCOUNTER — Ambulatory Visit (INDEPENDENT_AMBULATORY_CARE_PROVIDER_SITE_OTHER)
Admission: RE | Admit: 2024-05-11 | Discharge: 2024-05-11 | Disposition: A | Discharge status: Home / Self Care | Source: Ambulatory Visit | Attending: Family Medicine | Admitting: Family Medicine

## 2024-05-11 VITALS — BP 130/84 | HR 112 | Temp 98.0°F | Resp 16 | Ht 68.0 in | Wt 180.6 lb

## 2024-05-11 DIAGNOSIS — R5383 Other fatigue: Secondary | ICD-10-CM | POA: Diagnosis not present

## 2024-05-11 DIAGNOSIS — R110 Nausea: Secondary | ICD-10-CM | POA: Diagnosis not present

## 2024-05-11 DIAGNOSIS — R0609 Other forms of dyspnea: Secondary | ICD-10-CM

## 2024-05-11 DIAGNOSIS — R634 Abnormal weight loss: Secondary | ICD-10-CM

## 2024-05-11 MED ORDER — ONDANSETRON 4 MG PO TBDP: 4.0000 mg | ORAL_TABLET | Freq: Three times a day (TID) | ORAL | 0 refills | Status: AC | PRN

## 2024-05-11 NOTE — Patient Instructions (Signed)
 Please get your X-ray done in the basement of our [place] office located on: [address] [place], [place] [address]  You do not need an appointment for that location.   We will be in touch with your results.   Give us  2-3 business days to get the results of your labs back.   Try to drink 55-60 oz of water daily outside of exercise.  Let us  know if you need anything.

## 2024-05-11 NOTE — Progress Notes (Signed)
 Chief Complaint  Patient presents with   Follow-up    Follow Up    Subjective: Patient is a 48 y.o. female here for a myriad of issues.  Over the past 3 months, the patient is following she has had the flu.  She has decreased energy, night sweats, nausea, vomiting, cramping, constipation, bloating, gas, fatigue, and shortness of breath with exertion.  She will walk up a flight of stairs and have to take a break halfway through.  She has a family history of heart failure.  No swelling.  She has lost around 70 pounds on semaglutide .  She has not taken it in 8 days.  Historically she has tolerated this well.  No recent travel or fevers.  No history of diabetes.  Past Medical History:  Diagnosis Date   Anxiety with depression 07/01/2017   Fibromyalgia    Vaginal Pap smear, abnormal     Objective: BP 130/84 (BP Location: Left Arm, Patient Position: Sitting)   Pulse (!) 112   Temp 98 F (36.7 C) (Oral)   Resp 16   Ht 5' 8 (1.727 m)   Wt 180 lb 9.6 oz (81.9 kg)   LMP 03/05/2024   SpO2 100%   BMI 27.46 kg/m  General: Awake, appears stated age Mouth: MMD Heart: RRR, no LE edema Lungs: CTAB, no rales, wheezes or rhonchi. No accessory muscle use Abdomen: Bowel sounds present, soft, nontender, nondistended Skin: No rashes on exposed skin Psych: Age appropriate judgment and insight, normal affect and mood  Assessment and Plan: Weight loss - Plan: TSH, T4, free, Hemoglobin A1c  Fatigue, unspecified type - Plan: CBC, Comprehensive metabolic panel with GFR, VITAMIN D  25 Hydroxy (Vit-D Deficiency, Fractures)  Nausea - Plan: ondansetron  (ZOFRAN -ODT) 4 MG disintegrating tablet  DOE (dyspnea on exertion) - Plan: DG Chest 2 View  Check above labs.  Mostly could be related to semaglutide . Check above labs.  She is on CPAP for OSA.  Could be due to dehydration and poor oral intake.  Rule out metabolic causes as above. I think this is probably due to the semaglutide .  Will send in Zofran  as  needed for symptomatic relief. Check a chest x-ray.  If normal, will consider an echo.  This would be done after getting the results back from her labs. The patient voiced understanding and agreement to the plan.  Mabel [place], DO 05/11/24  5:16 PM

## 2024-05-12 ENCOUNTER — Ambulatory Visit: Payer: Self-pay | Admitting: Family Medicine

## 2024-05-12 DIAGNOSIS — R5383 Other fatigue: Secondary | ICD-10-CM

## 2024-05-12 LAB — CBC
HCT: 46.4 % — ABNORMAL HIGH (ref 36.0–46.0)
Hemoglobin: 15.1 g/dL — ABNORMAL HIGH (ref 12.0–15.0)
MCHC: 32.6 g/dL (ref 30.0–36.0)
MCV: 89.4 fl (ref 78.0–100.0)
Platelets: 356 K/uL (ref 150.0–400.0)
RBC: 5.18 Mil/uL — ABNORMAL HIGH (ref 3.87–5.11)
RDW: 13.7 % (ref 11.5–15.5)
WBC: 6.6 K/uL (ref 4.0–10.5)

## 2024-05-12 LAB — COMPREHENSIVE METABOLIC PANEL WITH GFR
ALT: 15 U/L (ref 0–35)
AST: 16 U/L (ref 0–37)
Albumin: 4.6 g/dL (ref 3.5–5.2)
Alkaline Phosphatase: 68 U/L (ref 39–117)
BUN: 14 mg/dL (ref 6–23)
CO2: 27 meq/L (ref 19–32)
Calcium: 9.4 mg/dL (ref 8.4–10.5)
Chloride: 102 meq/L (ref 96–112)
Creatinine, Ser: 1.41 mg/dL — ABNORMAL HIGH (ref 0.40–1.20)
GFR: 44.98 mL/min — ABNORMAL LOW (ref >60.00)
Glucose, Bld: 93 mg/dL (ref 70–99)
Potassium: 4.9 meq/L (ref 3.5–5.1)
Sodium: 137 meq/L (ref 135–145)
Total Bilirubin: 0.6 mg/dL (ref 0.2–1.2)
Total Protein: 7.9 g/dL (ref 6.0–8.3)

## 2024-05-12 LAB — TSH: TSH: 4.08 u[IU]/mL (ref 0.35–5.50)

## 2024-05-12 LAB — T4, FREE: Free T4: 0.81 ng/dL (ref 0.60–1.60)

## 2024-05-12 LAB — HEMOGLOBIN A1C: Hgb A1c MFr Bld: 5.5 % (ref 4.6–6.5)

## 2024-05-12 LAB — VITAMIN D 25 HYDROXY (VIT D DEFICIENCY, FRACTURES): VITD: 48.52 ng/mL (ref 30.00–100.00)

## 2024-05-15 ENCOUNTER — Telehealth: Payer: Self-pay

## 2024-05-15 NOTE — Telephone Encounter (Signed)
 Copied from CRM #[id number]. Topic: Clinical - Request for Lab/Test Order >> May 15, 2024  3:25 PM Jayma L wrote: Reason for CRM: patient was in lab called asking if we could send labs to where she was, in the message it states patient was to redo them a week later so she agreed she would go back. Asking we please send the orders to [facility] care and we can fax them to number [phone]. She needs to repeat a CBC, and BMP as the notes state from 05/12/2024.

## 2024-05-20 ENCOUNTER — Other Ambulatory Visit: Payer: Self-pay

## 2024-05-20 DIAGNOSIS — R5383 Other fatigue: Secondary | ICD-10-CM

## 2024-05-20 NOTE — Addendum Note (Signed)
 Addended by: Breeze Berringer M on: 05/20/2024 11:53 AM   Modules accepted: Orders

## 2024-05-27 LAB — LAB REPORT - SCANNED: EGFR: 52

## 2024-05-28 ENCOUNTER — Encounter: Payer: Self-pay | Admitting: Family Medicine

## 2024-06-12 ENCOUNTER — Other Ambulatory Visit: Payer: Self-pay | Admitting: Family Medicine

## 2024-06-12 DIAGNOSIS — Z1231 Encounter for screening mammogram for malignant neoplasm of breast: Secondary | ICD-10-CM

## 2024-06-23 ENCOUNTER — Other Ambulatory Visit

## 2024-06-26 ENCOUNTER — Ambulatory Visit

## 2024-06-26 DIAGNOSIS — Z1231 Encounter for screening mammogram for malignant neoplasm of breast: Secondary | ICD-10-CM

## 2024-07-14 ENCOUNTER — Telehealth: Payer: 59 | Admitting: Adult Health

## 2024-07-14 DIAGNOSIS — G4733 Obstructive sleep apnea (adult) (pediatric): Secondary | ICD-10-CM

## 2024-07-14 NOTE — Patient Instructions (Signed)
 Continue using CPAP nightly and greater than 4 hours each night Repeat home sleep test If your symptoms worsen or you develop new symptoms please let us  know.

## 2024-07-14 NOTE — Progress Notes (Signed)
 Virtual Visit via Video Note  I connected with Kerri Guerra on 07/14/24 at 11:00 AM EDT by a video enabled telemedicine application located remotely at [facility] and verified that I am speaking with the correct person using two identifiers who was located at their own home.  Verified that she is currently in [place]    I discussed the limitations of evaluation and management by telemedicine and the availability of in person appointments. The patient expressed understanding and agreed to proceed.      HISTORY OF PRESENT ILLNESS: Today 07/14/24:  Kerri Guerra is a 48 y.o. female with a history of obstructive sleep apnea on CPAP. Returns today for follow-up.  She states overall she tolerates the CPAP well although she does feel the pressure is too high even at 4 cm of water.  Occasionally during the night she states that she will take it off unknowingly.  Reports that she has been on a GLP and has lost weight.  Currently weighs 177.  When she did her sleep study she was at 250.  Her download is below     11/04/23: Kerri Guerra is a 48 y.o. female with a history of OSA on CPAP. Returns today for follow-up.  She reports that the CPAP is working well although she states that she still sleeps a lot and often does not keep the CPAP on the entire time she is sleeping.  She also has a DreamWear fullface and notices that water pools in the mask at night.  Which is part of the reason of her taking it off.  Her download is below        REVIEW OF SYSTEMS: Out of a complete 14 system review of symptoms, the patient complains only of the following symptoms, and all other reviewed systems are negative.  FSS ESS  ALLERGIES: No Known Allergies  HOME MEDICATIONS: Outpatient Medications Prior to Visit  Medication Sig Dispense Refill   buPROPion  (WELLBUTRIN  SR) 150 MG 12 hr tablet Take 150 mg by mouth every morning.     DULoxetine  (CYMBALTA ) 60 MG  capsule Take 1 capsule (60 mg total) by mouth daily. (Patient taking differently: Take 90 mg by mouth daily.) 90 capsule 2   hydrOXYzine (ATARAX) 10 MG tablet Take 10 mg by mouth daily as needed.     levocetirizine (XYZAL ) 5 MG tablet Take 1 tablet (5 mg total) by mouth every evening. 90 tablet 2   LINZESS  290 MCG CAPS capsule TAKE 1 CAPSULE(290 MCG) BY MOUTH DAILY BEFORE BREAKFAST 30 capsule 2   norethindrone -ethinyl estradiol  (LOESTRIN  1/20, 21,) 1-20 MG-MCG tablet Take 1 tablet by mouth daily. 21 tablet 1   ondansetron  (ZOFRAN -ODT) 4 MG disintegrating tablet Take 1 tablet (4 mg total) by mouth every 8 (eight) hours as needed for nausea or vomiting. 20 tablet 0   SEMAGLUTIDE -WEIGHT MANAGEMENT Roe Inject 1 Dose into the skin once a week.     No facility-administered medications prior to visit.    PAST MEDICAL HISTORY: Past Medical History:  Diagnosis Date   Anxiety with depression 07/01/2017   Fibromyalgia    Vaginal Pap smear, abnormal     PAST SURGICAL HISTORY: Past Surgical History:  Procedure Laterality Date   COLPOSCOPY     EGG RETRIEVAL SURGERY      FAMILY HISTORY: Family History  Problem Relation Age of Onset   Diabetes Mother    Hypertension Mother    Heart failure Mother    Prostate cancer Father  Sleep apnea Father    Sleep apnea Brother    Stomach cancer Maternal Grandmother    Stroke Neg Hx    Allergic rhinitis Neg Hx    Angioedema Neg Hx    Atopy Neg Hx    Asthma Neg Hx    Immunodeficiency Neg Hx    Eczema Neg Hx    Urticaria Neg Hx     SOCIAL HISTORY: Social History   Socioeconomic History   Marital status: Divorced    Spouse name: Not on file   Number of children: 0   Years of education: Not on file   Highest education level: Not on file  Occupational History   Occupation: Psychologist  Tobacco Use   Smoking status: Former    Current packs/day: 0.00    Types: Cigarettes    Quit date: 2021    Years since quitting: 4.7   Smokeless tobacco:  Never  Vaping Use   Vaping status: Never Used  Substance and Sexual Activity   Alcohol use: Yes    Alcohol/week: 3.0 standard drinks of alcohol    Types: 3 Standard drinks or equivalent per week    Comment: social   Drug use: No   Sexual activity: Yes  Other Topics Concern   Not on file  Social History Narrative   Not on file   Social Drivers of Health   Financial Resource Strain: Not on file  Food Insecurity: Not on file  Transportation Needs: Not on file  Physical Activity: Not on file  Stress: Not on file  Social Connections: Not on file  Intimate Partner Violence: Not on file      PHYSICAL EXAM    Generalized: Well developed, in no acute distress    Neurological examination  Mentation: Alert oriented to time, place, history taking. Follows all commands speech and language fluent Cranial nerve II-XII: Facial symmetry noted   DIAGNOSTIC DATA (LABS, IMAGING, TESTING) - I reviewed patient records, labs, notes, testing and imaging myself where available.  Lab Results  Component Value Date   WBC 6.6 05/11/2024   HGB 15.1 (H) 05/11/2024   HCT 46.4 (H) 05/11/2024   MCV 89.4 05/11/2024   PLT 356.0 05/11/2024      Component Value Date/Time   NA 137 05/11/2024 1620   NA 138 11/19/2022 1514   K 4.9 05/11/2024 1620   CL 102 05/11/2024 1620   CO2 27 05/11/2024 1620   GLUCOSE 93 05/11/2024 1620   BUN 14 05/11/2024 1620   BUN 15 11/19/2022 1514   CREATININE 1.41 (H) 05/11/2024 1620   CREATININE 1.06 11/15/2017 1638   CALCIUM 9.4 05/11/2024 1620   PROT 7.9 05/11/2024 1620   PROT 6.8 11/19/2022 1514   ALBUMIN 4.6 05/11/2024 1620   ALBUMIN 3.9 11/19/2022 1514   AST 16 05/11/2024 1620   ALT 15 05/11/2024 1620   ALKPHOS 68 05/11/2024 1620   BILITOT 0.6 05/11/2024 1620   BILITOT <0.2 11/19/2022 1514   Lab Results  Component Value Date   CHOL 148 05/15/2022   HDL 42 05/15/2022   LDLCALC 91 05/15/2022   TRIG 77 05/15/2022   CHOLHDL 3.5 05/15/2022   Lab  Results  Component Value Date   HGBA1C 5.5 05/11/2024   Lab Results  Component Value Date   VITAMINB12 1,207 05/15/2022   Lab Results  Component Value Date   TSH 4.08 05/11/2024      ASSESSMENT AND PLAN 48 y.o. year old female  has a past medical history of Anxiety  with depression (07/01/2017), Fibromyalgia, and Vaginal Pap smear, abnormal. here with:  OSA on CPAP  - CPAP compliance satisfactory - Good treatment of AHI  - Encourage patient to use CPAP nightly and > 4 hours each night - Patient has had significant weight loss since her original sleep study.  Will repeat a home sleep test. - F/U in 6-8 months or sooner if needed   Duwaine Russell, MSN, NP-C 07/14/2024, 11:03 AM [facility] [address] [place], [place] [address] [phone]  The patient's condition requires frequent monitoring and adjustments in the treatment plan, reflecting the ongoing complexity of care.  This provider is the continuing focal point for all needed services for this condition.

## 2024-07-23 ENCOUNTER — Other Ambulatory Visit: Payer: Self-pay | Admitting: Medical Genetics

## 2024-07-23 DIAGNOSIS — Z006 Encounter for examination for normal comparison and control in clinical research program: Secondary | ICD-10-CM

## 2024-08-12 ENCOUNTER — Encounter: Payer: Self-pay | Admitting: Family Medicine

## 2024-08-14 ENCOUNTER — Ambulatory Visit: Admitting: Neurology

## 2024-08-14 DIAGNOSIS — G4733 Obstructive sleep apnea (adult) (pediatric): Secondary | ICD-10-CM | POA: Diagnosis not present

## 2024-08-14 NOTE — Addendum Note (Signed)
 Addended by: PENNSTROM, JORDYN on: 08/14/2024 03:56 PM   Modules accepted: Orders

## 2024-08-17 ENCOUNTER — Other Ambulatory Visit: Payer: Self-pay | Admitting: Medical Genetics

## 2024-08-17 DIAGNOSIS — Z006 Encounter for examination for normal comparison and control in clinical research program: Secondary | ICD-10-CM

## 2024-09-10 NOTE — Progress Notes (Unsigned)
 Kerri Guerra

## 2024-09-11 NOTE — Procedures (Signed)
 [facility]  HOME SLEEP TEST (SANSA) REPORT (Mail-Out Device):   STUDY DATE: 08/25/2024  DOB: Jan 14, 1976  MRN: [id number]  ORDERING CLINICIAN: True Mar, MD, PhD   REFERRING CLINICIAN: Duwaine Russell, NP  CLINICAL INFORMATION/HISTORY (obtained from visit note dated 07/14/2024): 48 year old female with an underlying medical history of fibromyalgia, anxiety, depression, OSA, and overweight state, who presents for reevaluation of her obstructive sleep apnea.  She has had significant weight loss since her original sleep apnea diagnosis.  BMI (at the time of sleep clinic visit and/or test date): 27.5 kg/m  FINDINGS:   Study Protocol:    The SANSA single-point-of-skin-contact chest-worn sensor - an FDA cleared and DOT approved type 4 home sleep test device - measures eight physiological channels,  including blood oxygen saturation (measured via PPG [photoplethysmography]), EKG-derived heart rate, respiratory effort, chest movement (measured via accelerometer), snoring, body position, and actigraphy. The device is designed to be worn for up to 10 hours per study.   Sleep Summary:   Total Recording Time (hours, min): 8 hours, 54 min  Total Effective Sleep Time (hours, min):  4 hours, 3 min  Sleep Efficiency (%):    46%   Respiratory Indices:   Calculated sAHI (per hour):  3.2/hour         Oxygen Saturation Statistics:    Oxygen Saturation (%) Mean: 97.4%   Minimum oxygen saturation (%):                 83.3%   O2 Saturation Range (%): 83.3-100%   Time below or at 88% saturation: <1 min   Pulse Rate Statistics:   Pulse Mean (bpm):    87/min    Pulse Range (48-117/min)   Snoring: Intermittent, mild  IMPRESSION/DIAGNOSES:   Primary snoring  RECOMMENDATIONS:   This home sleep test does not demonstrate any significant obstructive sleep disordered breathing with a total AHI of less than 5/hour.  Her total AHI was 3.2/hour, O2 nadir of 83.3%,  without any significant time below or at 88% saturation for the night (less than 1 minute).  Snoring was detected, intermittently, in the mild range.  Of note, the patient did not sleep very much with an estimated total sleep time of just about 4 hours.  Nonetheless, treatment with a positive airway pressure device such as AutoPap or CPAP is not indicated based on this test. Snoring may improve with avoidance of the supine sleep position and some degree of additional weight loss (where clinically appropriate).   For disturbing snoring, an oral appliance through dentistry or orthodontics can be considered.  Other causes of the patient's symptoms, including circadian rhythm disturbances, an underlying mood disorder, medication effect and/or an underlying medical problem cannot be ruled out based on this test. Clinical correlation is recommended.  The patient should be cautioned not to drive, work at heights, or operate dangerous or heavy equipment when tired or sleepy. Review and reiteration of good sleep hygiene measures should be pursued with any patient. The patient will be advised to follow up with her referring provider, who will be notified of the test results.   I certify that I have reviewed the raw data recording prior to the issuance of this report in accordance with the standards of the American Academy of Sleep Medicine (AASM).    INTERPRETING PHYSICIAN:   True Mar, MD, PhD Medical Director, [facility]) Diplomat, ABPN (Neurology and Sleep)   [facility] [address] [place],  [place] [address] [phone]

## 2024-09-13 ENCOUNTER — Ambulatory Visit: Payer: Self-pay | Admitting: Adult Health

## 2024-09-15 ENCOUNTER — Other Ambulatory Visit: Payer: Self-pay | Admitting: *Deleted

## 2024-09-15 DIAGNOSIS — R0683 Snoring: Secondary | ICD-10-CM

## 2024-09-15 LAB — GENECONNECT MOLECULAR SCREEN: Genetic Analysis Overall Interpretation: NEGATIVE

## 2024-09-15 NOTE — Telephone Encounter (Signed)
 D/c cpap order sent to [facility].

## 2024-09-15 NOTE — Telephone Encounter (Signed)
 New, Adine Boring, Heather CROME, RN; Joylene Adine; Tucker, Dolanda; Ziegler, Melissa; Cain, Merilee Received, thank you!
